# Patient Record
Sex: Female | Born: 1980 | Race: White | Hispanic: No | Smoking: Former smoker
Health system: Southern US, Community
[De-identification: ages and names within clinical notes are randomized; demographics above are authoritative.]

## PROBLEM LIST (undated history)

## (undated) DIAGNOSIS — R112 Nausea with vomiting, unspecified: Secondary | ICD-10-CM

## (undated) DIAGNOSIS — H6990 Unspecified Eustachian tube disorder, unspecified ear: Secondary | ICD-10-CM

## (undated) DIAGNOSIS — K5190 Ulcerative colitis, unspecified, without complications: Secondary | ICD-10-CM

## (undated) DIAGNOSIS — K219 Gastro-esophageal reflux disease without esophagitis: Secondary | ICD-10-CM

## (undated) DIAGNOSIS — Z9889 Other specified postprocedural states: Secondary | ICD-10-CM

## (undated) DIAGNOSIS — G4730 Sleep apnea, unspecified: Secondary | ICD-10-CM

## (undated) DIAGNOSIS — T753XXA Motion sickness, initial encounter: Secondary | ICD-10-CM

## (undated) HISTORY — DX: Sleep apnea, unspecified: G47.30

## (undated) HISTORY — DX: Unspecified eustachian tube disorder, unspecified ear: H69.90

## (undated) HISTORY — PX: ANKLE SURGERY: SHX546

## (undated) HISTORY — DX: Ulcerative colitis, unspecified, without complications: K51.90

---

## 2015-11-08 DIAGNOSIS — K5190 Ulcerative colitis, unspecified, without complications: Secondary | ICD-10-CM | POA: Insufficient documentation

## 2016-12-07 HISTORY — PX: COLONOSCOPY: SHX174

## 2018-09-11 DIAGNOSIS — M79672 Pain in left foot: Secondary | ICD-10-CM | POA: Insufficient documentation

## 2018-09-29 DIAGNOSIS — M792 Neuralgia and neuritis, unspecified: Secondary | ICD-10-CM | POA: Insufficient documentation

## 2018-10-26 DIAGNOSIS — G5702 Lesion of sciatic nerve, left lower limb: Secondary | ICD-10-CM | POA: Insufficient documentation

## 2018-10-26 DIAGNOSIS — M6281 Muscle weakness (generalized): Secondary | ICD-10-CM | POA: Insufficient documentation

## 2019-03-01 DIAGNOSIS — M7541 Impingement syndrome of right shoulder: Secondary | ICD-10-CM | POA: Insufficient documentation

## 2019-03-01 HISTORY — DX: Impingement syndrome of right shoulder: M75.41

## 2020-09-22 ENCOUNTER — Other Ambulatory Visit: Payer: Self-pay

## 2020-09-22 ENCOUNTER — Ambulatory Visit (INDEPENDENT_AMBULATORY_CARE_PROVIDER_SITE_OTHER): Payer: No Typology Code available for payment source

## 2020-09-22 ENCOUNTER — Ambulatory Visit (INDEPENDENT_AMBULATORY_CARE_PROVIDER_SITE_OTHER): Payer: No Typology Code available for payment source | Admitting: Podiatry

## 2020-09-22 DIAGNOSIS — M79672 Pain in left foot: Secondary | ICD-10-CM | POA: Diagnosis not present

## 2020-09-22 DIAGNOSIS — M25572 Pain in left ankle and joints of left foot: Secondary | ICD-10-CM

## 2020-09-22 DIAGNOSIS — G8929 Other chronic pain: Secondary | ICD-10-CM

## 2020-09-22 NOTE — Progress Notes (Signed)
   HPI: 40 y.o. female presenting today as a new patient for evaluation of chronic pain that is been going on for about 5 years.  Patient states that in 2017 she began to experience pain in her left foot and ankle.  Over the past year the pain has exacerbated significantly and it is incredibly painful to walk.  She has been to multiple physicians in the past and tried different custom orthotics as well as immobilization in the cam boot without any improvement.  She continues to have severe pain and she presents for further treatment and evaluation  No past medical history on file.   Physical Exam: General: The patient is alert and oriented x3 in no acute distress.  Dermatology: Skin is warm, dry and supple bilateral lower extremities. Negative for open lesions or macerations.  Vascular: Palpable pedal pulses bilaterally. No edema or erythema noted. Capillary refill within normal limits.  Neurological: Epicritic and protective threshold grossly intact bilaterally.   Musculoskeletal Exam: Range of motion within normal limits to all pedal and ankle joints bilateral. Muscle strength 5/5 in all groups bilateral.  Pain on palpation diffusely throughout the left foot and ankle  Radiographic Exam:  Normal osseous mineralization. Joint spaces preserved. No fracture/dislocation/boney destruction.    Assessment: 1.  Chronic severe pain left foot and ankle   Plan of Care:  1. Patient evaluated. X-Rays reviewed.  2.  The patient has now had this pain ongoing for about 5 years now.  Isolated to the left foot and ankle.  She states that other physicians have tried to attribute the pain to sciatica but she is doubtful. 3.  Today we are going to order MRI left foot and ankle 4.  Return to clinic after MRI results to discuss further treatment options and review the MRI  *Billing/coding for Rhunette Croft, DPM [facility]  Dr. Felecia Shelling, DPM    [address]  [place], [place] [address]                Office [phone]  Fax [phone]

## 2020-10-15 ENCOUNTER — Ambulatory Visit
Admission: RE | Admit: 2020-10-15 | Discharge: 2020-10-15 | Disposition: A | Discharge status: Home / Self Care | Payer: Self-pay | Source: Ambulatory Visit | Attending: Podiatry | Admitting: Podiatry

## 2020-10-15 ENCOUNTER — Ambulatory Visit
Admission: RE | Admit: 2020-10-15 | Discharge: 2020-10-15 | Disposition: A | Discharge status: Home / Self Care | Payer: PRIVATE HEALTH INSURANCE | Source: Ambulatory Visit | Attending: Podiatry | Admitting: Podiatry

## 2020-10-15 ENCOUNTER — Other Ambulatory Visit: Payer: Self-pay

## 2020-10-15 DIAGNOSIS — G8929 Other chronic pain: Secondary | ICD-10-CM

## 2020-10-15 DIAGNOSIS — M25572 Pain in left ankle and joints of left foot: Secondary | ICD-10-CM

## 2020-10-15 IMAGING — MR MR FOOT*L* W/O CM
5 series · 40 of 40 positions shown · non-contrast
Comparison: Right foot radiograph [DATE]

CLINICAL DATA: Foot pain, chronic, stress or occult fracture
suspected, xray done

EXAM:
MRI OF THE LEFT FOOT WITHOUT CONTRAST
TECHNIQUE: Multiplanar, multisequence MR imaging of the left forefoot was
performed. No intravenous contrast was administered.

[Series 4: T1 · coronal · left · 3.0mm · 0.31mm/px · 12 of 44 slices shown (1 of 2)]
[im 1/44]
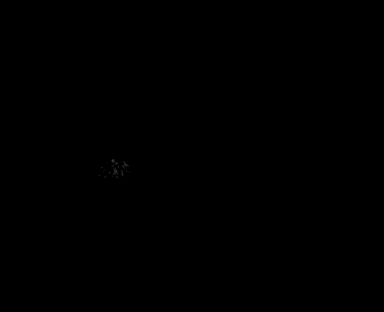
[im 4/44]
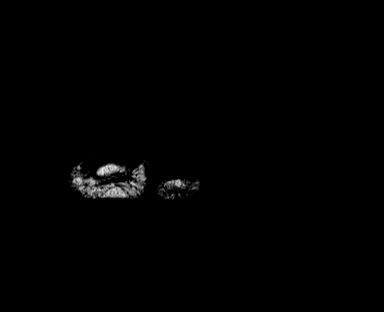
[im 8/44]
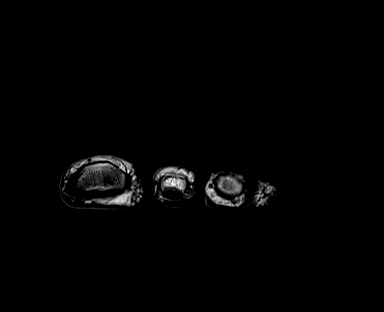
[im 12/44]
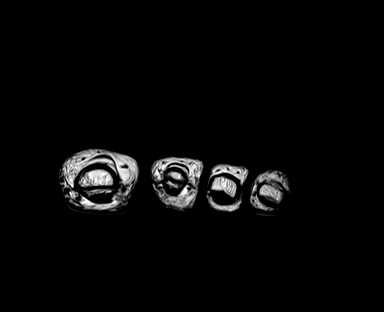
[im 16/44]
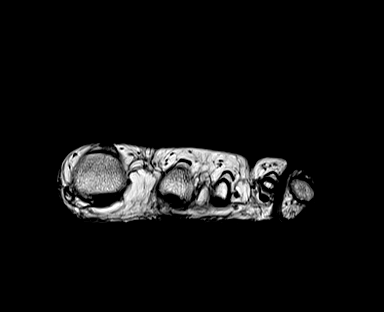
[im 20/44]
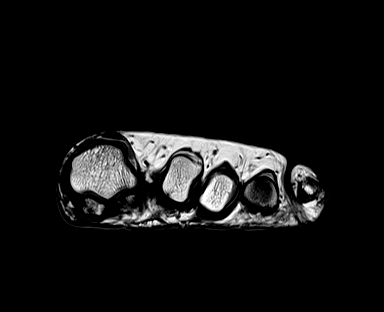
[im 24/44]
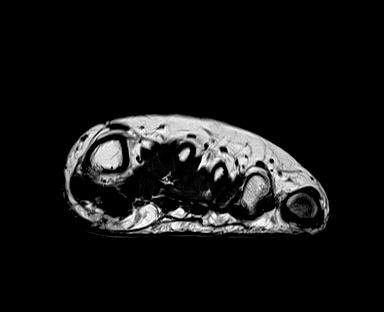
[im 28/44]
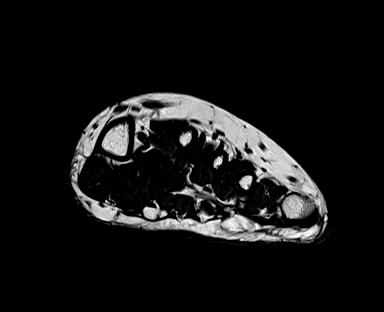
[im 32/44]
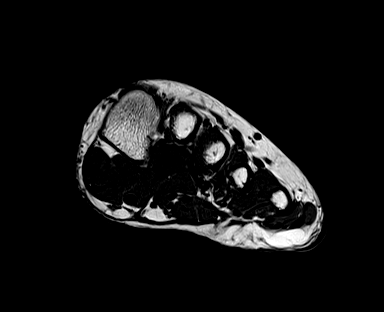
[im 36/44]
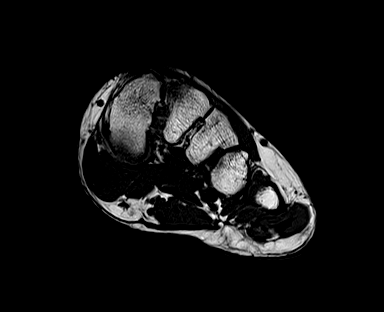
[im 40/44]
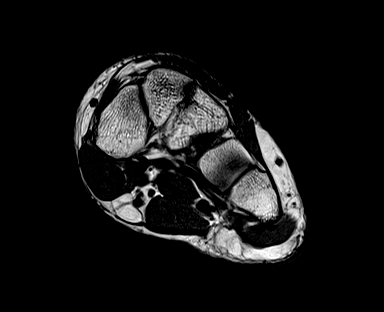
[im 44/44]
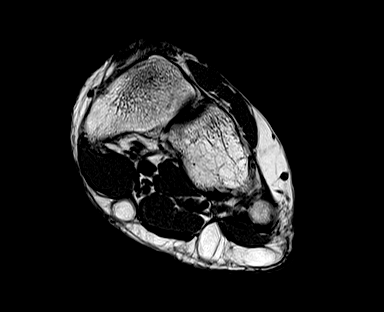

[Series 5: T2 fat-sat · coronal · left · 3.0mm · 0.31mm/px · 11 of 44 slices shown (1 of 2)]
[im 1/44]
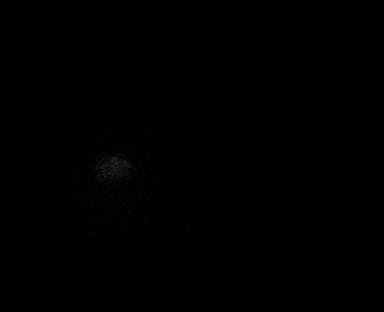
[im 5/44]
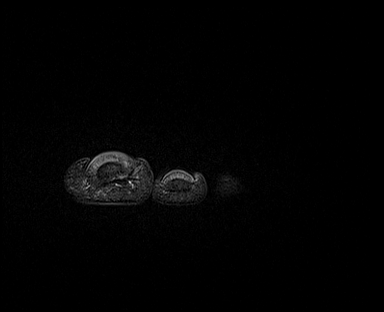
[im 9/44]
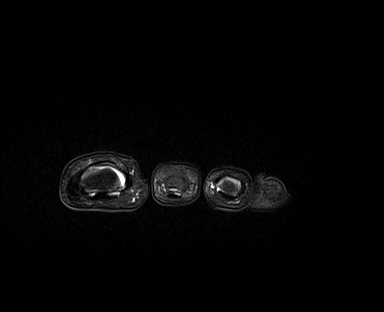
[im 13/44]
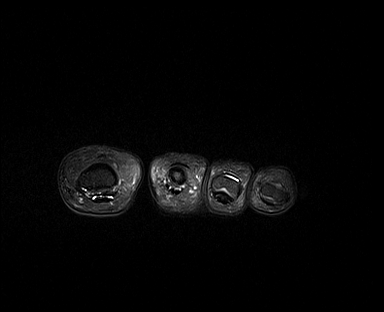
[im 18/44]
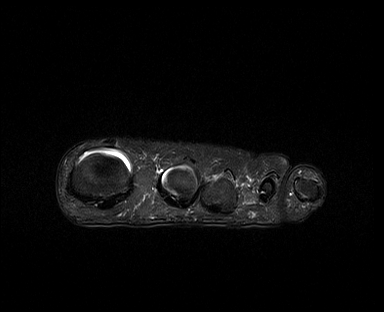
[im 22/44]
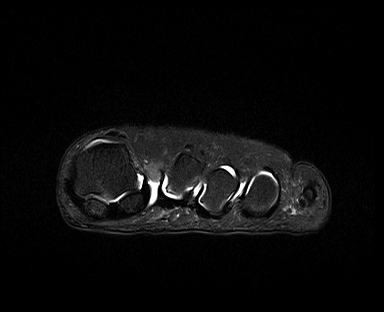
[im 26/44]
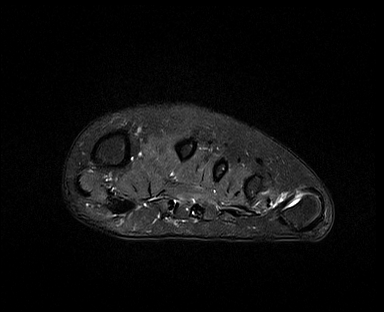
[im 31/44]
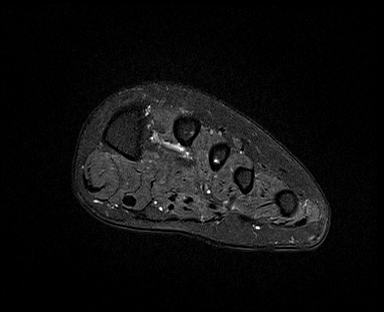
[im 35/44]
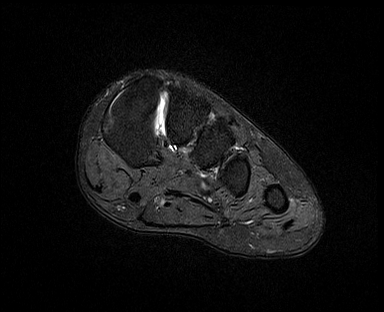
[im 39/44]
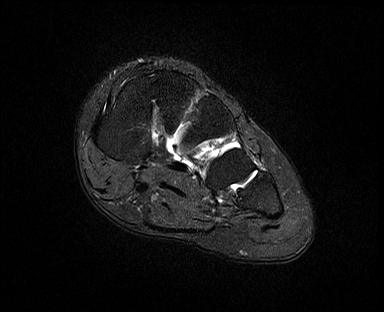
[im 44/44]
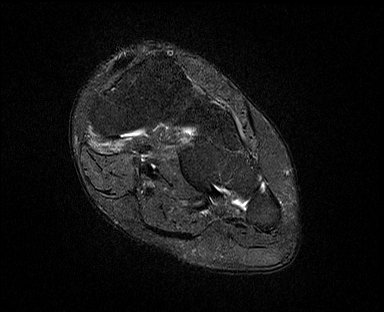

[Series 6: STIR · sagittal · left · 3.0mm · 0.70mm/px · 7 of 27 slices shown]
[im 1/27]
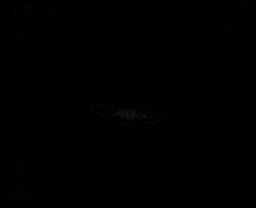
[im 5/27]
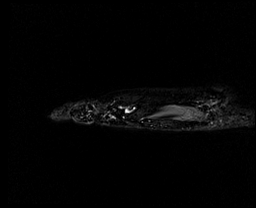
[im 9/27]
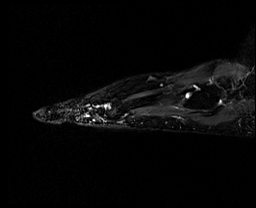
[im 14/27]
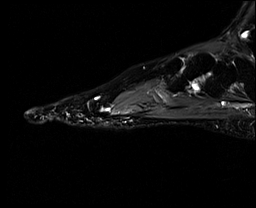
[im 18/27]
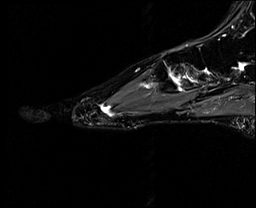
[im 22/27]
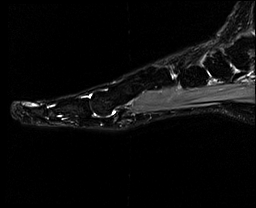
[im 27/27]
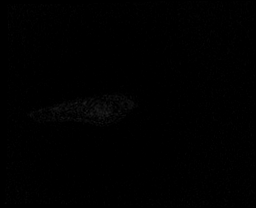

[Series 7: T1 · axial · left · 3.0mm · 0.47mm/px · z∈[-46,+30]mm · 5 of 21 slices shown (2 of 2)]
[im 1/21]
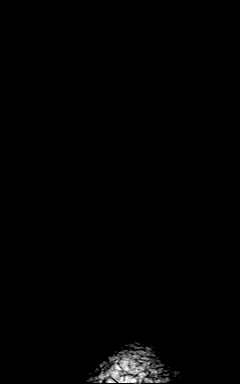
[im 6/21]
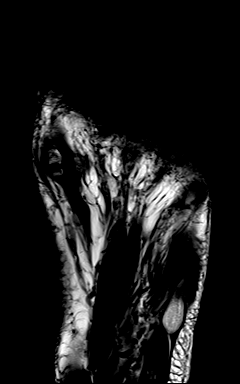
[im 11/21]
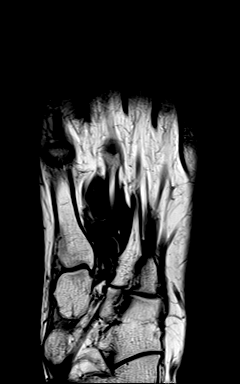
[im 16/21]
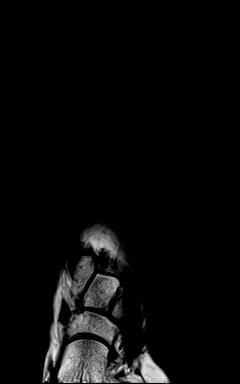
[im 21/21]
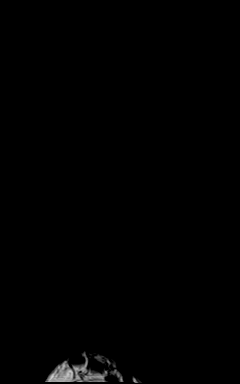

[Series 8: T2 fat-sat · axial · left · 3.0mm · 0.47mm/px · z∈[-46,+30]mm · 5 of 21 slices shown (2 of 2)]
[im 1/21]
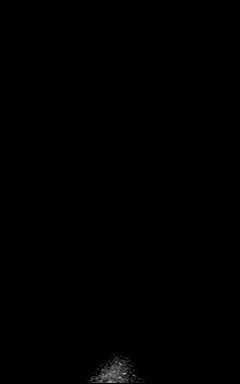
[im 6/21]
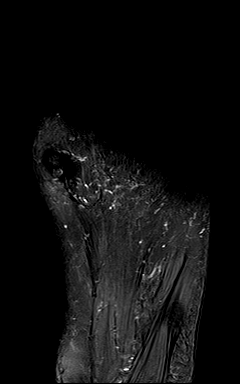
[im 11/21]
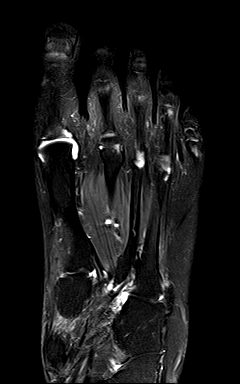
[im 16/21]
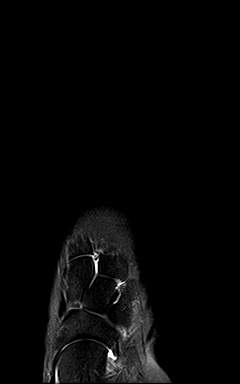
[im 21/21]
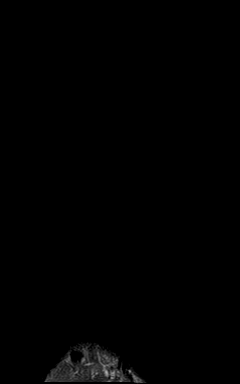

[40 of 40 positions shown; findings below may reference images not displayed]

FINDINGS: Bones/Joint/Cartilage

The cortex is intact. There is no significant marrow signal
alteration.

Ligaments

Intact Lisfranc ligament. Intact lateral ligaments. No evidence of
plantar plate tear.

Muscles and Tendons

No significant muscle atrophy.  No muscle edema.

Soft tissues

No focal fluid collection. No cystic or solid mass. No evidence of
intermetatarsal neuroma or bursitis.
IMPRESSION: Unremarkable left foot MRI.  No acute abnormality.

## 2020-10-15 IMAGING — MR MR ANKLE*L* W/O CM
4 of 5 series · 13 of 40 positions shown · non-contrast
Comparison: Right foot radiograph [DATE].

CLINICAL DATA: Ankle pain, neg xray

EXAM:
MRI OF THE LEFT ANKLE WITHOUT CONTRAST
TECHNIQUE: Multiplanar, multisequence MR imaging of the ankle was performed. No
intravenous contrast was administered.

[Series 4: PD fat-sat · axial · left · 3.0mm · 0.25mm/px · z∈[-12,+84]mm · 4 of 30 slices shown]
[im 1/30]
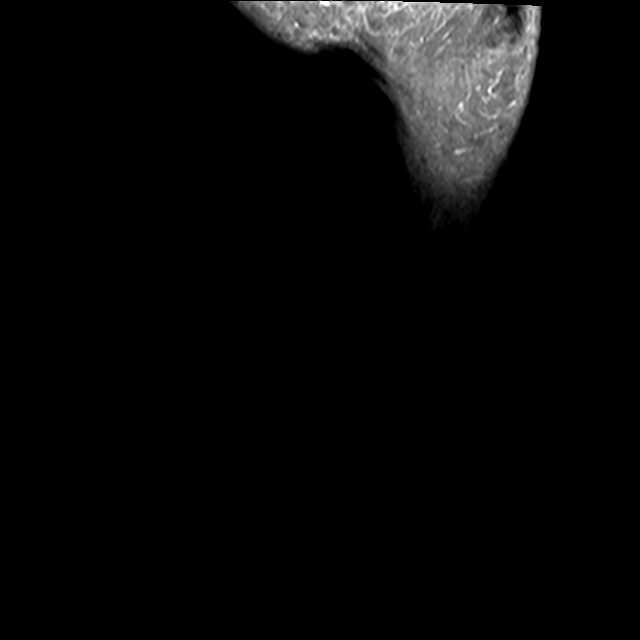
[im 5/30]
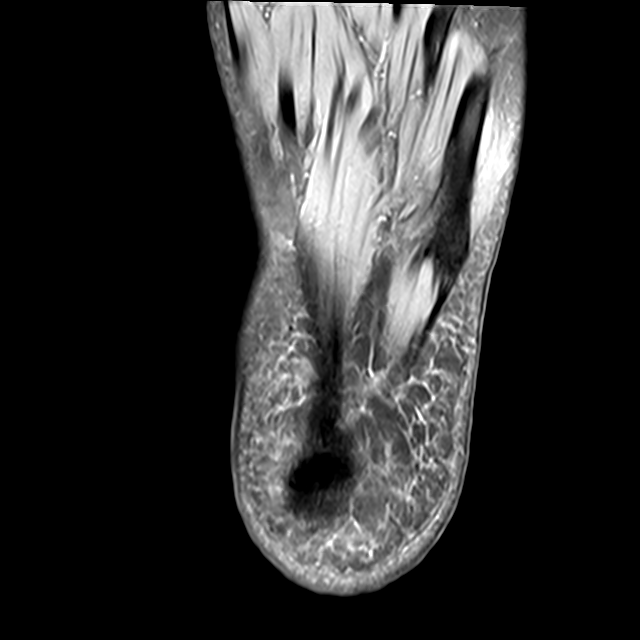
[im 17/30]
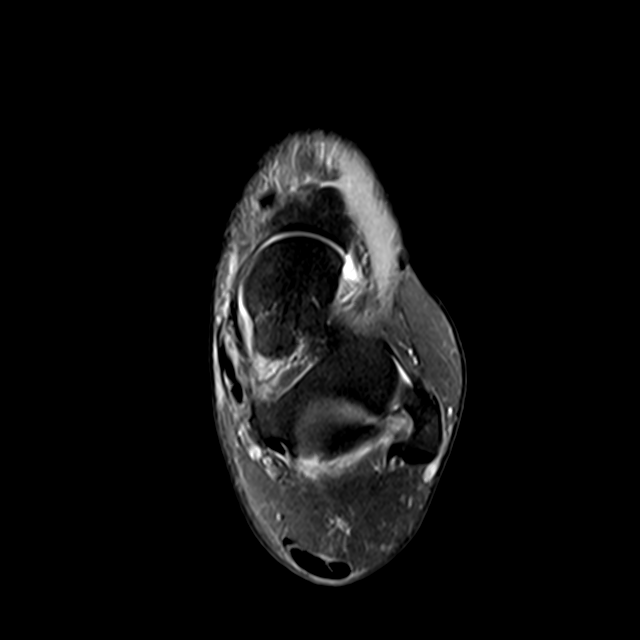
[im 25/30]
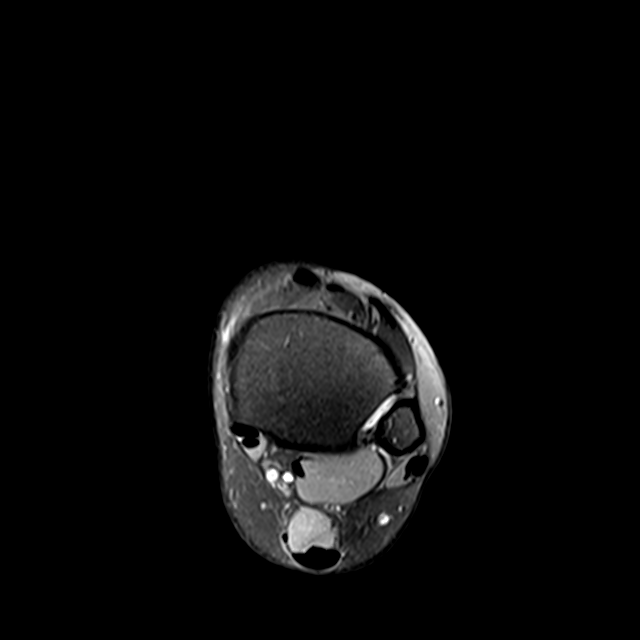

[Series 5: T2 fat-sat · axial · left · 3.0mm · 0.25mm/px · z∈[-0,+88]mm · 3 of 30 slices shown (1 of 2)]
[im 4/30]
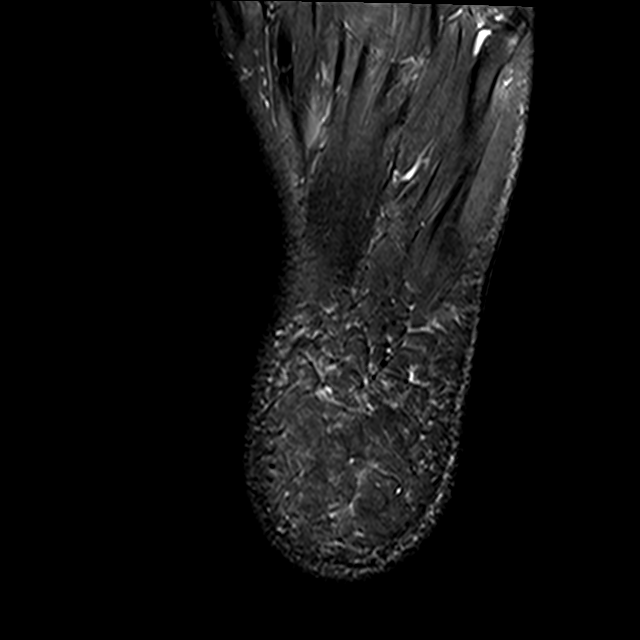
[im 15/30]
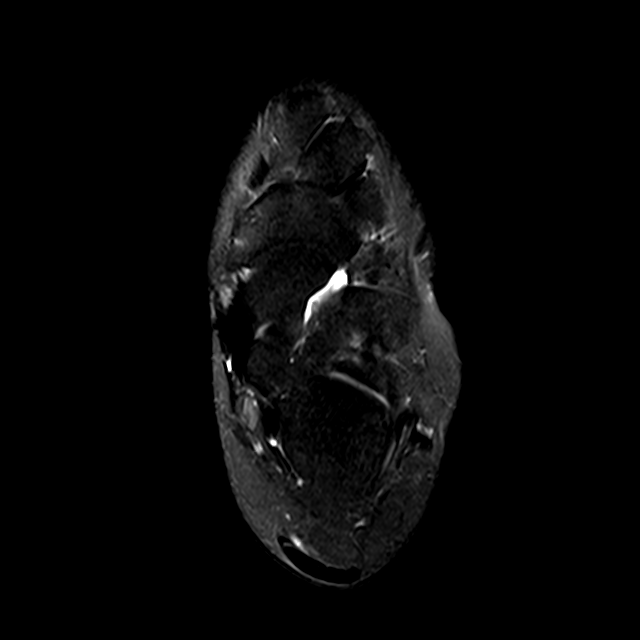
[im 26/30]
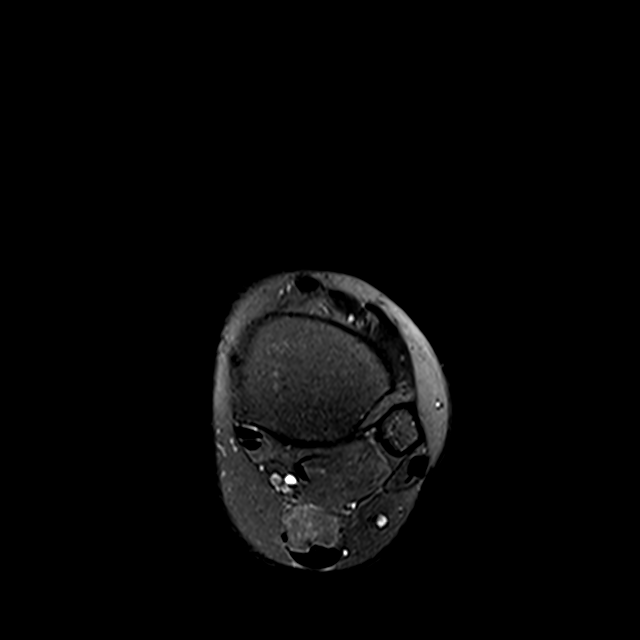

[Series 6: T1 · sagittal · left · 4.0mm · 0.27mm/px · 3 of 24 slices shown]
[im 4/24]
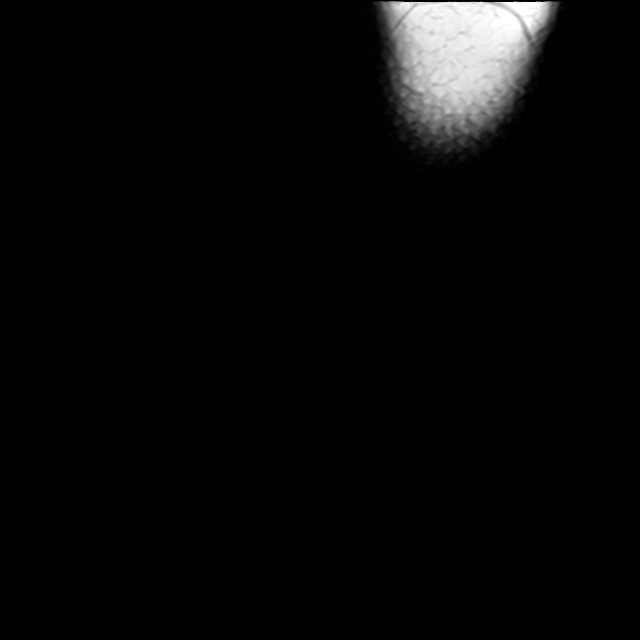
[im 12/24]
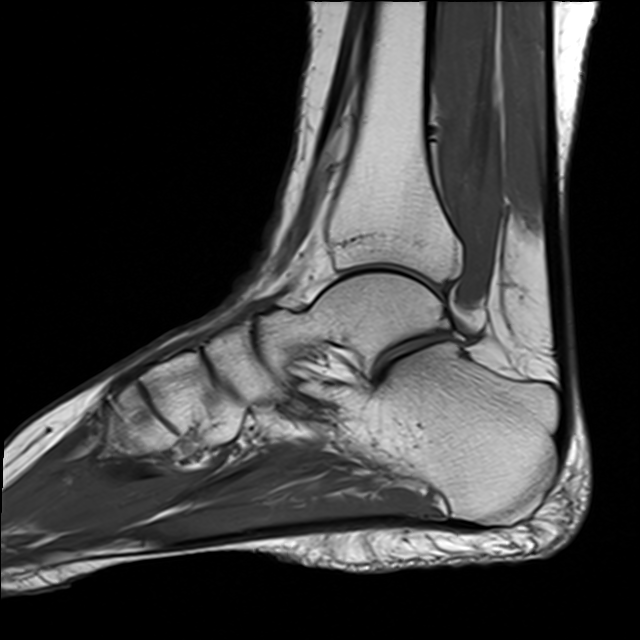
[im 20/24]
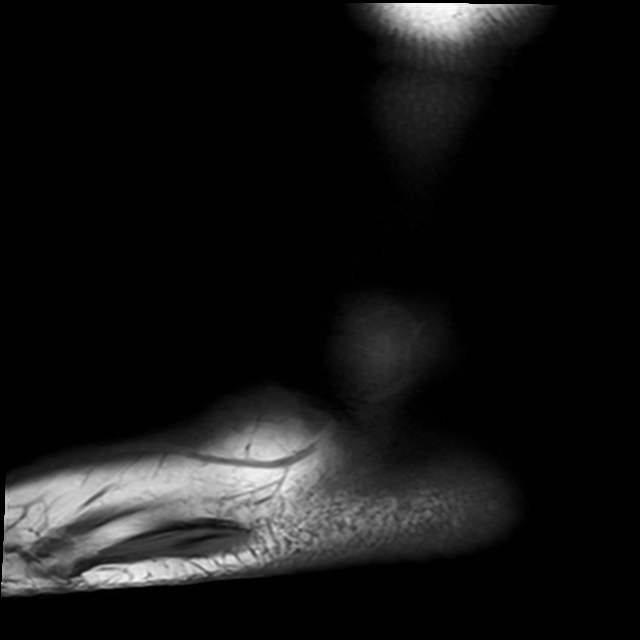

[Series 8: T2 fat-sat · coronal · left · 3.0mm · 0.25mm/px · 3 of 33 slices shown (2 of 2)]
[im 5/33]
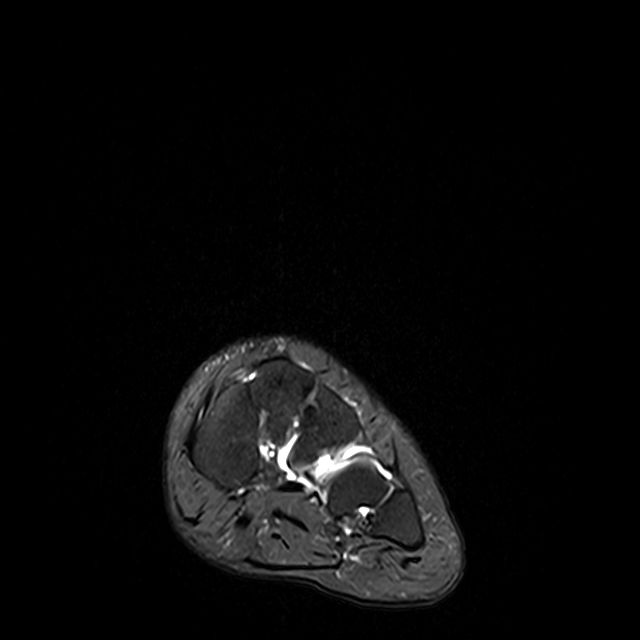
[im 17/33]
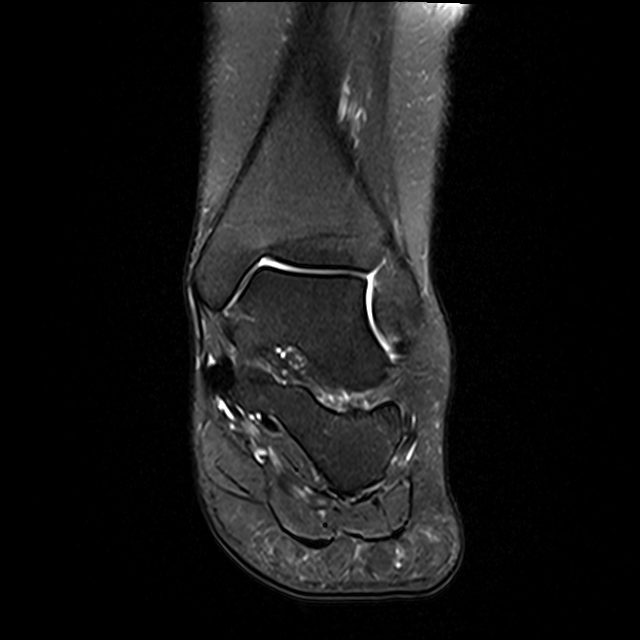
[im 29/33]
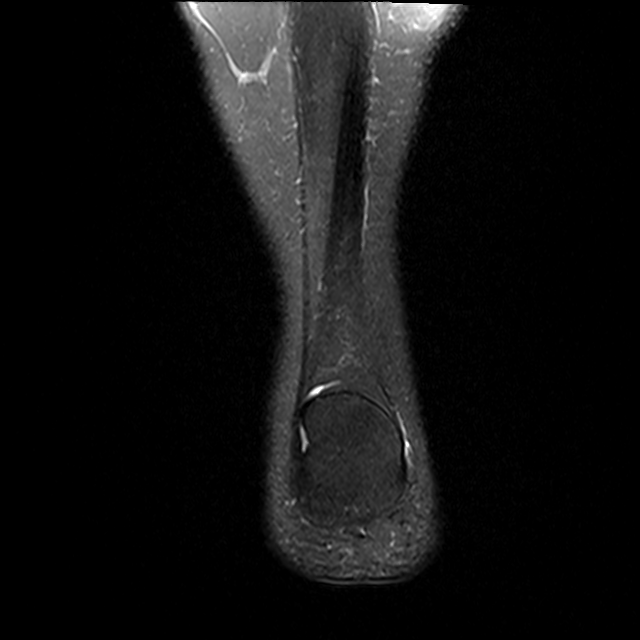

[13 of 40 positions shown; findings below may reference images not displayed]

FINDINGS: TENDONS

Peroneal: Peroneal longus and brevis tendons are intact.

Posteromedial: Posterior tibial tendon intact. Flexor digitorum
longus tendon intact. Flexor hallucis longus tendon intact.

Anterior: Tibialis anterior tendon intact. Extensor hallucis longus
tendon intact Extensor digitorum longus tendon intact.

Achilles:  Intact.

Plantar Fascia: Intact.

LIGAMENTS

Lateral: Anterior talofibular ligament intact. Calcaneofibular
ligament intact. Posterior talofibular ligament intact. Anterior and
posterior tibiofibular ligaments intact.

Medial: Deltoid ligament intact. Spring ligament intact.

CARTILAGE

Ankle Joint: No joint effusion. Normal ankle mortise. No chondral
defect.

Subtalar Joints/Sinus Tarsi: Normal subtalar joints. No subtalar
joint effusion. Normal sinus tarsi.

Bones: No marrow signal abnormality.  No fracture or dislocation.

Soft Tissue: No fluid collection or hematoma. Muscles are normal
without edema or atrophy. Tarsal tunnel is normal.
IMPRESSION: Unremarkable left ankle MRI. No evidence of tendon or ligamentous
injury. No acute osseous abnormality.

## 2020-11-06 ENCOUNTER — Ambulatory Visit: Payer: No Typology Code available for payment source | Admitting: Podiatry

## 2020-11-17 ENCOUNTER — Other Ambulatory Visit: Payer: Self-pay

## 2020-11-17 ENCOUNTER — Ambulatory Visit (INDEPENDENT_AMBULATORY_CARE_PROVIDER_SITE_OTHER): Payer: No Typology Code available for payment source | Admitting: Podiatry

## 2020-11-17 DIAGNOSIS — M79672 Pain in left foot: Secondary | ICD-10-CM

## 2020-11-17 DIAGNOSIS — G8929 Other chronic pain: Secondary | ICD-10-CM | POA: Diagnosis not present

## 2020-11-17 DIAGNOSIS — M25572 Pain in left ankle and joints of left foot: Secondary | ICD-10-CM | POA: Diagnosis not present

## 2020-11-17 NOTE — Progress Notes (Signed)
   HPI: 40 y.o. female presenting today for follow-up evaluation of chronic right foot and ankle pain.  Patient had MRIs performed since last visit and she presents today to review the results and discuss different treatment options  No past medical history on file.   Physical Exam: General: The patient is alert and oriented x3 in no acute distress.  Dermatology: Skin is warm, dry and supple bilateral lower extremities. Negative for open lesions or macerations.  Vascular: Palpable pedal pulses bilaterally. No edema or erythema noted. Capillary refill within normal limits.  Neurological: Epicritic and protective threshold grossly intact bilaterally.   Musculoskeletal Exam: Range of motion within normal limits to all pedal and ankle joints bilateral. Muscle strength 5/5 in all groups bilateral.   MR foot LT wo contrast 10/15/2020: IMPRESSION: Unremarkable left foot MRI.  No acute abnormality.  MR ankle LT wo contrast 10/15/2020: IMPRESSION: Unremarkable left ankle MRI. No evidence of tendon or ligamentous injury. No acute osseous abnormality.   Assessment: 1.  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 Patient evaluated.  MRIs reviewed.  2.  Unfortunately the patient has had pain and tenderness associated to the right foot and ankle for the past 5 years which is slowly increased over the past several months.  Unfortunately she has tried multiple conservative modalities including physical therapy, steroid injections, nerve blocks, custom orthotics.  She is unable to take NSAIDs due to ulcerative colitis.  Unfortunately I am not able to offer any additional modalities the patient has not already pursued 3.  The patient would like to pursue chiropractic care.  I do not believe this would help significantly but I am not opposed to pursuing this modality since other modalities have failed 4.  Return to clinic as needed      Felecia Shelling, DPM [facility]  Dr. Felecia Shelling, DPM    [address] [place], [place] [address]                Office [phone]  Fax [phone]

## 2020-12-13 ENCOUNTER — Ambulatory Visit: Payer: Self-pay | Admitting: Family Medicine

## 2020-12-26 ENCOUNTER — Ambulatory Visit (INDEPENDENT_AMBULATORY_CARE_PROVIDER_SITE_OTHER): Payer: No Typology Code available for payment source | Admitting: Family Medicine

## 2020-12-26 ENCOUNTER — Encounter: Payer: Self-pay | Admitting: Family Medicine

## 2020-12-26 ENCOUNTER — Other Ambulatory Visit: Payer: Self-pay

## 2020-12-26 VITALS — BP 124/84 | HR 87 | Ht 63.0 in | Wt 151.0 lb

## 2020-12-26 DIAGNOSIS — G5702 Lesion of sciatic nerve, left lower limb: Secondary | ICD-10-CM | POA: Diagnosis not present

## 2020-12-26 DIAGNOSIS — M5410 Radiculopathy, site unspecified: Secondary | ICD-10-CM | POA: Insufficient documentation

## 2020-12-26 DIAGNOSIS — M4727 Other spondylosis with radiculopathy, lumbosacral region: Secondary | ICD-10-CM

## 2020-12-26 DIAGNOSIS — Z23 Encounter for immunization: Secondary | ICD-10-CM

## 2020-12-26 HISTORY — DX: Other spondylosis with radiculopathy, lumbosacral region: M47.27

## 2020-12-26 MED ORDER — DULOXETINE HCL 30 MG PO CPEP: ORAL_CAPSULE | ORAL | 0 refills | Status: DC

## 2020-12-26 NOTE — Assessment & Plan Note (Signed)
See additional assessment(s) for plan details. 

## 2020-12-26 NOTE — Assessment & Plan Note (Signed)
Patient presenting with roughly 6 years of primarily left lateral foot pain, describes radiation proximally to the deep left buttock region, the symptoms were atraumatic in onset, noted after carrying/pregnancy with her child in 2015.  At that time she had a relatively brief period of sharper symptoms from the low back to her left leg to about her foot since that time symptoms outside of her foot had been negligible and near absent.  Her left lateral foot symptoms have persisted, was diagnosed with ankle instability.  Over the past 2 years she has noted recurrence of low back and primarily deep gluteal pain associated with prolonged sitting or standing, during these episodes her foot symptoms are progressively worse.  Over the course of treatment by outside providers she has had EMG studies, imaging of her low back, advanced imaging of her left foot and ankle- all of which, per patient, were relatively benign.  From a treatment standpoint she has had what she describes as epidural injections, nerve blocks, varying oral medications, and physical therapy.  She did note improvement in her pain with Lyrica but could not tolerate due to reported hair loss.  Physical examination today reveals maintained sensation in bilateral lower extremities that is symmetric, she has positive straight leg raise on the left which recreates her symptoms, positive piriformis tenderness and pain during FABER, symmetric tenderness at bilateral SI joints, left greater trochanter, and tracking along the gluteus medius and piriformis.  Foot/ankle findings benign.  Given her constellation of symptoms, evaluation and management thus far, primary concern is for lumbosacral radiculopathy with comorbid piriformis syndrome.  I reviewed the nature of this condition as well as treatment strategy given her comorbid ulcerative colitis/relative medical contraindications.  We will initiate duloxetine at 30 mg with plan for self-titration in 2 weeks,  follow-up in 6 weeks for reevaluation.  If tolerating medication well with improvement, plan for home-based versus formal physical therapy.  If suboptimal response, adjunct ultrasound-guided piriformis injection to be considered.

## 2020-12-26 NOTE — Patient Instructions (Signed)
-   Start duloxetine 1 capsule (30 mg) daily x2 weeks - Start duloxetine 2 capsules (60 mg) daily after 2 weeks until follow-up - Return for follow-up in 6 weeks, contact us for any questions between now and then

## 2020-12-26 NOTE — Progress Notes (Signed)
Primary Care / Sports Medicine Office Visit  Patient Information:  Patient ID: Michaela Cox, female DOB: [birth year] Age: 40 y.o. MRN: [id number]   Michaela Cox is a pleasant 40 y.o. female presenting with the following:  Chief Complaint  Patient presents with   New Patient (Initial Visit)   Establish Care    Patient Active Problem List   Diagnosis Date Noted   Radicular pain of left lower extremity 12/26/2020   Impingement syndrome of right shoulder region 03/01/2019   Piriformis syndrome, left 10/26/2018   Muscle weakness 10/26/2018   Neuropathic pain 09/29/2018   Pain in left foot 09/11/2018   Ulcerative colitis ([place]) 11/08/2015    Vitals:   12/26/20 0922  BP: 124/84  Pulse: 87  SpO2: 99%   Vitals:   12/26/20 0922  Weight: 151 lb (68.5 kg)  Height: 5\' 3"  (1.6 m)   Body mass index is 26.75 kg/m.  No results found.   Independent interpretation of notes and tests performed by another provider:   None  Procedures performed:   None  Pertinent History, Exam, Impression, and Recommendations:   Piriformis syndrome, left Patient presenting with roughly 6 years of primarily left lateral foot pain, describes radiation proximally to the deep left buttock region, the symptoms were atraumatic in onset, noted after carrying/pregnancy with her child in 2015.  At that time she had a relatively brief period of sharper symptoms from the low back to her left leg to about her foot since that time symptoms outside of her foot had been negligible and near absent.  Her left lateral foot symptoms have persisted, was diagnosed with ankle instability.  Over the past 2 years she has noted recurrence of low back and primarily deep gluteal pain associated with prolonged sitting or standing, during these episodes her foot symptoms are progressively worse.  Over the course of treatment by outside providers she has had EMG studies, imaging of her low back, advanced imaging of her left  foot and ankle- all of which, per patient, were relatively benign.  From a treatment standpoint she has had what she describes as epidural injections, nerve blocks, varying oral medications, and physical therapy.  She did note improvement in her pain with Lyrica but could not tolerate due to reported hair loss.  Physical examination today reveals maintained sensation in bilateral lower extremities that is symmetric, she has positive straight leg raise on the left which recreates her symptoms, positive piriformis tenderness and pain during FABER, symmetric tenderness at bilateral SI joints, left greater trochanter, and tracking along the gluteus medius and piriformis.  Foot/ankle findings benign.  Given her constellation of symptoms, evaluation and management thus far, primary concern is for lumbosacral radiculopathy with comorbid piriformis syndrome.  I reviewed the nature of this condition as well as treatment strategy given her comorbid ulcerative colitis/relative medical contraindications.  We will initiate duloxetine at 30 mg with plan for self-titration in 2 weeks, follow-up in 6 weeks for reevaluation.  If tolerating medication well with improvement, plan for home-based versus formal physical therapy.  If suboptimal response, adjunct ultrasound-guided piriformis injection to be considered.  Radicular pain of left lower extremity See additional assessment(s) for plan details.   Orders & Medications Meds ordered this encounter  Medications   DULoxetine (CYMBALTA) 30 MG capsule    Sig: Take 1 capsule (30 mg total) by mouth every evening for 14 days, THEN 2 capsules (60 mg total) every evening.    Dispense:  74 capsule  Refill:  0   Orders Placed This Encounter  Procedures   Flu Vaccine QUAD 92mo+IM (Fluarix, Fluzone & Alfiuria Quad PF)     Return in about 6 weeks (around 02/06/2021).     Jerrol Banana, MD   Primary Care Sports Medicine [facility]

## 2021-02-06 ENCOUNTER — Other Ambulatory Visit: Payer: Self-pay

## 2021-02-06 ENCOUNTER — Ambulatory Visit (INDEPENDENT_AMBULATORY_CARE_PROVIDER_SITE_OTHER): Payer: No Typology Code available for payment source | Admitting: Family Medicine

## 2021-02-06 ENCOUNTER — Encounter: Payer: Self-pay | Admitting: Family Medicine

## 2021-02-06 VITALS — BP 118/78 | HR 82 | Ht 63.0 in | Wt 152.0 lb

## 2021-02-06 DIAGNOSIS — M5410 Radiculopathy, site unspecified: Secondary | ICD-10-CM | POA: Diagnosis not present

## 2021-02-06 DIAGNOSIS — G5702 Lesion of sciatic nerve, left lower limb: Secondary | ICD-10-CM | POA: Diagnosis not present

## 2021-02-06 MED ORDER — GABAPENTIN 100 MG PO CAPS: 100.0000 mg | ORAL_CAPSULE | Freq: Every day | ORAL | 1 refills | Status: DC

## 2021-02-06 MED ORDER — CYCLOBENZAPRINE HCL 5 MG PO TABS: 5.0000 mg | ORAL_TABLET | Freq: Every evening | ORAL | 0 refills | Status: DC | PRN

## 2021-02-06 MED ORDER — DULOXETINE HCL 30 MG PO CPEP: 30.0000 mg | ORAL_CAPSULE | Freq: Every day | ORAL | 0 refills | Status: DC

## 2021-02-06 NOTE — Progress Notes (Signed)
°  ° °  Primary Care / Sports Medicine Office Visit  Patient Information:  Patient ID: Michaela Cox, female DOB: 1980-01-31 Age: 41 y.o. MRN: [id number]   Michaela Cox is a pleasant 41 y.o. female presenting with the following:  Chief Complaint  Patient presents with   Piriformis syndrome, left    Better; 1/10 pain    Vitals:   02/06/21 0911  BP: 118/78  Pulse: 82  SpO2: 99%   Vitals:   02/06/21 0911  Weight: 152 lb (68.9 kg)  Height: 5\' 3"  (1.6 m)   Body mass index is 26.93 kg/m.  No results found.   Independent interpretation of notes and tests performed by another provider:   None  Procedures performed:   None  Pertinent History, Exam, Impression, and Recommendations:   Piriformis syndrome, left Patient presents for follow-up to chronic left deep hip pain with paresthesias down the left leg, ongoing for roughly 6 years.  At the last visit on 12/26/2020 concern was for lumbosacral radiculopathy with comorbid piriformis syndrome, held from NSAIDs due to GI concern, initiated duloxetine at 30 mg with titration to 60 mg.  She states that she was able to titrate duloxetine accordingly, has not noted any benefit from this course, continues to note symptoms with day-to-day activity, no significant low back pain noted.  From examination standpoint, she now has a negative straight leg raise.  She does continue to demonstrate positive Corky Sox and positive piriformis testing with now more focal pain tract along the piriformis musculature.  During piriformis manipulation she does endorse left lower extremity onset of paresthesias.  Her findings are again consistent with piriformis syndrome of the left involving the sciatic nerve, uncontrolled.  I reviewed next steps from a treatment standpoint and, from medication management standpoint, we will wean patient off of duloxetine by transition to 30 mg a dose x1 week then discontinue, additionally initiate as needed cyclobenzaprine 5  mg, and nightly gabapentin 100 mg.  She will return for close follow-up to assess response to these interventions.  If suboptimal progress noted, can further titrate medications and proceed with ultrasound-guided corticosteroid injection of the piriformis/sciatic nerve junction.  Chronic condition, uncontrolled, Rx management  Radicular pain of left lower extremity See additional assessment(s) for plan details.   Orders & Medications Meds ordered this encounter  Medications   DULoxetine (CYMBALTA) 30 MG capsule    Sig: Take 1 capsule (30 mg total) by mouth daily.    Dispense:  7 capsule    Refill:  0   gabapentin (NEURONTIN) 100 MG capsule    Sig: Take 1 capsule (100 mg total) by mouth at bedtime.    Dispense:  30 capsule    Refill:  1   cyclobenzaprine (FLEXERIL) 5 MG tablet    Sig: Take 1 tablet (5 mg total) by mouth at bedtime as needed for muscle spasms.    Dispense:  30 tablet    Refill:  0   Orders Placed This Encounter  Procedures   Ambulatory referral to Physical Therapy     Return in about 1 day (around 02/07/2021).     Montel Culver, MD   Primary Care Sports Medicine [place]

## 2021-02-06 NOTE — Patient Instructions (Addendum)
-   Transition dose of duloxetine to 30 mg, take for 1 week then discontinue - Start gabapentin nightly - Can dose cyclobenzaprine on an as-needed basis for muscle tightness pain - Return once scheduled for follow-up - Referral coordinator will contact you for physical therapy scheduling - Contact us for any questions between now and then

## 2021-02-06 NOTE — Assessment & Plan Note (Addendum)
Patient presents for follow-up to chronic left deep hip pain with paresthesias down the left leg, ongoing for roughly 6 years.  At the last visit on 12/26/2020 concern was for lumbosacral radiculopathy with comorbid piriformis syndrome, held from NSAIDs due to GI concern, initiated duloxetine at 30 mg with titration to 60 mg.  She states that she was able to titrate duloxetine accordingly, has not noted any benefit from this course, continues to note symptoms with day-to-day activity, no significant low back pain noted.  From examination standpoint, she now has a negative straight leg raise.  She does continue to demonstrate positive Corky Sox and positive piriformis testing with now more focal pain tract along the piriformis musculature.  During piriformis manipulation she does endorse left lower extremity onset of paresthesias.  Her findings are again consistent with piriformis syndrome of the left involving the sciatic nerve, uncontrolled.  I reviewed next steps from a treatment standpoint and, from medication management standpoint, we will wean patient off of duloxetine by transition to 30 mg a dose x1 week then discontinue, additionally initiate as needed cyclobenzaprine 5 mg, and nightly gabapentin 100 mg.  She will return for close follow-up to assess response to these interventions.  If suboptimal progress noted, can further titrate medications and proceed with ultrasound-guided corticosteroid injection of the piriformis/sciatic nerve junction.  Chronic condition, uncontrolled, Rx management

## 2021-02-06 NOTE — Assessment & Plan Note (Signed)
See additional assessment(s) for plan details. 

## 2021-02-08 ENCOUNTER — Encounter: Payer: Self-pay | Admitting: Family Medicine

## 2021-02-08 ENCOUNTER — Inpatient Hospital Stay: Payer: Self-pay | Admitting: Radiology

## 2021-02-08 ENCOUNTER — Other Ambulatory Visit: Payer: Self-pay

## 2021-02-08 ENCOUNTER — Ambulatory Visit (INDEPENDENT_AMBULATORY_CARE_PROVIDER_SITE_OTHER): Payer: No Typology Code available for payment source | Admitting: Family Medicine

## 2021-02-08 VITALS — BP 132/90 | HR 83 | Ht 63.0 in | Wt 152.0 lb

## 2021-02-08 DIAGNOSIS — M5410 Radiculopathy, site unspecified: Secondary | ICD-10-CM

## 2021-02-08 DIAGNOSIS — G5702 Lesion of sciatic nerve, left lower limb: Secondary | ICD-10-CM

## 2021-02-08 MED ORDER — TRIAMCINOLONE ACETONIDE 40 MG/ML IJ SUSP
80.0000 mg | Freq: Once | INTRAMUSCULAR | Status: AC
  Administered 2021-02-08: 80 mg via INTRAMUSCULAR

## 2021-02-08 NOTE — Assessment & Plan Note (Signed)
Patient presents for scheduled left piriformis trigger point injection and sciatic nerve injection with cortisone in the setting of piriformis syndrome.  Patient tolerated the procedure well, post care reviewed, I have advised her to contact physical therapy for expedited scheduling, perform this for 6 weeks and return thereafter.  Over the interim she can dose cyclobenzaprine on as-needed basis and we have discussed titration of gabapentin by 100 mg every 3 nights to find lowest dose that adequately controls neuropathic pain symptoms in the left lower extremity.

## 2021-02-08 NOTE — Patient Instructions (Addendum)
You have just been given a cortisone injection to reduce pain and inflammation. After the injection you may notice immediate relief of pain as a result of the Lidocaine. It is important to rest the area of the injection for 24 to 48 hours after the injection. There is a possibility of some temporary increased discomfort and swelling for up to 72 hours until the cortisone begins to work. If you do have pain, simply rest the joint and use ice. If you can tolerate over the counter medications, you can try Tylenol for added relief per package instructions. - Start physical therapy once scheduled, contact number for low for expedited scheduling - Continue gabapentin nightly, can increase by 1 capsule every 3 nights until he reaches lowest dose that adequately controls shooting pain down the leg - Continue nightly cyclobenzaprine on an as-needed basis - Return for follow-up after physical therapy in 6 weeks - Contact us for any questions between now and then [facility]:  [place]:  [phone]  [place]: [phone]

## 2021-02-08 NOTE — Assessment & Plan Note (Signed)
See additional assessment(s) for plan details. 

## 2021-02-08 NOTE — Progress Notes (Signed)
°  ° °  Primary Care / Sports Medicine Office Visit  Patient Information:  Patient ID: Savayah Schachner, female DOB: 1980/12/03 Age: 41 y.o. MRN: [id number]   Brodie Voisine is a pleasant 41 y.o. female presenting with the following:  Chief Complaint  Patient presents with   Hip Pain    Injection    Ankle Pain    Vitals:   02/08/21 1512  BP: 132/90  Pulse: 83  SpO2: 99%   Vitals:   02/08/21 1512  Weight: 152 lb (68.9 kg)  Height: 5\' 3"  (1.6 m)   Body mass index is 26.93 kg/m.  No results found.   Independent interpretation of notes and tests performed by another provider:   None  Procedures performed:   Procedure:  Injection of sciatic nerve at the junction of the piriformis under ultrasound guidance. Ultrasound guidance utilized for visualization of the piriformis and sciatic nerve junction, hypoechoic piriformis can be consistent with interstitial atrophy, injectate visualized Samsung HS60 device utilized with permanent recording / reporting. Verbal informed consent obtained and verified. Skin prepped in a sterile fashion. Ethyl chloride for topical local analgesia.  Completed without difficulty and tolerated well. Medication: triamcinolone acetonide 40 mg/mL suspension for injection 1 mL total and 2 mL lidocaine 1% without epinephrine utilized for needle placement anesthetic Advised to contact for fevers/chills, erythema, induration, drainage, or persistent bleeding.   Pertinent History, Exam, Impression, and Recommendations:   Piriformis syndrome, left Patient presents for scheduled left piriformis trigger point injection and sciatic nerve injection with cortisone in the setting of piriformis syndrome.  Patient tolerated the procedure well, post care reviewed, I have advised her to contact physical therapy for expedited scheduling, perform this for 6 weeks and return thereafter.  Over the interim she can dose cyclobenzaprine on as-needed basis and we have discussed  titration of gabapentin by 100 mg every 3 nights to find lowest dose that adequately controls neuropathic pain symptoms in the left lower extremity.  Radicular pain of left lower extremity See additional assessment(s) for plan details.   Orders & Medications Meds ordered this encounter  Medications   triamcinolone acetonide (KENALOG-40) injection 80 mg   Orders Placed This Encounter  Procedures   [place] LIMITED JOINT SPACE STRUCTURES LOW LEFT     No follow-ups on file.     Montel Culver, MD   Primary Care Sports Medicine [place]

## 2021-02-13 ENCOUNTER — Other Ambulatory Visit: Payer: Self-pay

## 2021-02-13 ENCOUNTER — Ambulatory Visit: Payer: No Typology Code available for payment source | Attending: Family Medicine | Admitting: Physical Therapy

## 2021-02-13 ENCOUNTER — Encounter: Payer: Self-pay | Admitting: Physical Therapy

## 2021-02-13 DIAGNOSIS — R293 Abnormal posture: Secondary | ICD-10-CM | POA: Insufficient documentation

## 2021-02-13 DIAGNOSIS — G5702 Lesion of sciatic nerve, left lower limb: Secondary | ICD-10-CM | POA: Diagnosis not present

## 2021-02-13 DIAGNOSIS — M25552 Pain in left hip: Secondary | ICD-10-CM | POA: Insufficient documentation

## 2021-02-13 DIAGNOSIS — M6281 Muscle weakness (generalized): Secondary | ICD-10-CM | POA: Diagnosis present

## 2021-02-13 DIAGNOSIS — M5410 Radiculopathy, site unspecified: Secondary | ICD-10-CM | POA: Insufficient documentation

## 2021-02-13 NOTE — Therapy (Signed)
[facility] [address] [place], [place], [address] Phone: [phone]   Fax:  [phone]  Physical Therapy Evaluation  Patient Details  Name: Michaela Cox MRN: [id number] Date of Birth: 1980/11/03 Referring Provider (PT): Montel Culver   Encounter Date: 02/13/2021   PT End of Session - 02/13/21 1156     Visit Number 1    Number of Visits 9    Date for PT Re-Evaluation 03/13/21    Authorization Type IE 02/13/2021    PT Start Time 1200    PT Stop Time 1240    PT Time Calculation (min) 40 min    Activity Tolerance Patient tolerated treatment well    Behavior During Therapy [facility] for tasks assessed/performed             Past Medical History:  Diagnosis Date   Eustachian tube disorder    Ulcerative colitis ([place])     Past Surgical History:  Procedure Laterality Date   COLONOSCOPY      There were no vitals filed for this visit.        [facility] PT Assessment - 02/13/21 0001       Assessment   Medical Diagnosis piriformis syndrome L    Referring Provider (PT) Montel Culver    Hand Dominance Right    Prior Therapy None      Balance Screen   Has the patient fallen in the past 6 months No             SUBJECTIVE Chief complaint:  Patient reports that for 2 years she has been dealing with LLE pain. It originated in the foot/ankle and then radiates to the L SIJ. Patient notes pain is always present, but varying intensity. Patient notes that the sensation just feels weak like it is going to give out. Patient endorses associated heaviness with the affected limb. Patient has previously had experiences with the LLE giving way one of which resulted in a fall (sometime last year). Patient adds that sitting and walking are provocative. Patient has had to adjust wellness activities to rowing and cycling to avoid pain. Patient denies radiating symptoms, but notes the pain rather provokes another pain site. Patient states  that the only symptom that seems to encompass the whole leg is swelling.   " Over the course of treatment by outside providers she has had EMG studies, imaging of her low back, advanced imaging of her left foot and ankle- all of which, per patient, were relatively benign." Per MD.   Pain location: L ankle, L gluteals Pain: Present 4-5/10, Best 1-2/10, Worst 6/10: Pain quality: pain quality: dull and weakness Radiating pain: No  Numbness/Tingling: No  24 hour pain behavior:  Aggravating factors: sitting, walking/hiking Easing factors: position changes How long can you sit: < 5 min How long can you stand: 30 min How long can you walk: 30 min  Occupational demands: sedentary desk job  Current activities: caregiving, cleaning; walking/hiking (unable because of leg)  Patient stated goals: Return to hiking, manage pain  SCREENING Red Flags: None Have you had any night sweats? Unexplained weight loss? Saddle anesthesia? Unexplained changes in bowel or bladder habits?  Mental Status Patient is oriented to person, place and time.  Recent memory is intact.  Remote memory is intact.  Attention span and concentration are intact.  Expressive speech is intact.  Patient's fund of knowledge is within normal limits for educational level.  POSTURE/OBSERVATIONS:  Lumbar lordosis: diminished Thoracic  kyphosis: increased in length Iliac crest height: L elevated Lumbar lateral shift: negative Pelvic obliquity: L posterior  REFLEX TESTING: Clonus: Negative  GAIT: Trendelenburg R: Positive L: Positive; diminished pelvic rotation during gait; corrected with donning of heel lift. B Trendelenburg also eliminated with addition of R heel lift.  RANGE OF MOTION:    LEFT RIGHT  Lumbar forward flexion (65):  WFL    Lumbar extension (30): WFL    Lumbar lateral flexion (25):  [facility]  Thoracic and Lumbar rotation (30):    [facility] WFL  Hip Flexion (125):   WNL WNL  Hip IR (45):  WNL WNL  Hip ER  (45):  WNL WNL  Hip Abduction (40):  WNL WNL  Hip extension (15):  WNL WNL   SENSATION: Grossly intact to light touch bilateral LEs as determined by testing dermatomes L2-S2. Patient does note some diminished sensation with testing of L3 dermatome. Proprioception and hot/cold testing deferred on this date  STRENGTH: MMT   RLE LLE  Hip Flexion 5 4  Hip Abduction (seated) 4 4  Hip Adduction (seated) 4 4* tingle to the foot  Hip ER  4 4  Hip IR  3+ 3+  Knee Extension 5 5* along tibia  Knee Flexion 5 5* L foot tingly  Dorsiflexion  4 4  Plantarflexion (seated) 5 5   REPEATED MOVEMENTS: No centralization or peripheralization of symptoms with repeated lumbar extension or flexion.    MUSCLE LENGTH: Hamstring length: > 70 degrees B Quad length Pat Patrick): negative Hip Flexor length Marcello Moores): negative IT band length Nicoletta Dress): negative  PALPATION: TTP over L glute med, L piriformis/ middle fibers of glute max, L posterior sacral ligaments. Pulse palpable at dorsalis pedis, L.    SPECIAL TESTS SLR (SN 92, -LR 0.29): R: Negative L:  Negative FABER (SN 81): R: Negative L: Negative FADIR (SN 94): R: Negative L: Negative Distraction (DU20): R: Negative L: Negative Compression (SN/SP 69): R: Negative L: Negative  FUNCTIONAL TESTS: Sit to stand: Negative   ASSESSMENT Patient is a 41 year old presenting to clinic with chief complaints of persistent L hip and ankle pain. Upon examination, patient demonstrates deficits in L hip strength, pain, posture, and gait as evidenced by B Trendelenburg sign during gait with diminished pelvic rotation,elevation of L IC, L posterior rotation of ASIS, tenderness to palpation of L SIJ and L posterior sacral ligaments, excessive range of motion of B hips with gross 4/5 LLE strength. Patient's responses on FOTO outcome measures (62) indicate moderate functional limitations/disability/distress. Patient's progress may be limited due to persistent nature of complaint  and occupational demands; however, patient's motivation and positive response to heel lift in clinic today are advantageous. Patient was able to achieve basic understanding of pelvic posture/alignment interventions during today's evaluation and responded positively to manual interventions. Patient will benefit from continued skilled therapeutic intervention to address deficits in L hip strength, pain, posture, and gait in order to increase function and improve overall QOL.  EDUCATION Patient educated on prognosis, POC, and provided with HEP including: seated heel squeeze, seated pelvic MET. Patient articulated understanding and returned demonstration. Patient will benefit from further education in order to maximize compliance and understanding for long-term therapeutic gains.       Objective measurements completed on examination: See above findings.                     PT Long Term Goals - 02/13/21 1258       PT  LONG TERM GOAL #1   Title Patient will be independent with HEP in order to decrease hip pain and increase strength in order to improve pain-free function at home and work.    Baseline IE: initiated    Time 4    Period Weeks    Status New    Target Date 03/13/21      PT LONG TERM GOAL #2   Title Patient will demonstrate improved function as evidenced by a score of >74 on FOTO measure for full participation in activities at home and in the community.    Baseline IE: 27    Time 4    Period Weeks    Status New    Target Date 03/13/21      PT LONG TERM GOAL #3   Title Patient will decrease worst pain as reported on NPRS by at least 2 points to demonstrate clinically significant reduction in pain in order to restore/improve function and overall QOL.    Baseline IE: 10/10    Time 4    Period Weeks    Status New    Target Date 03/13/21      PT LONG TERM GOAL #4   Title Patient will improved LLE hip strength to 5/5 on MMT with pain at or below 3/10 on NPRS for  improved activity tolerance and participation in activities at home and in the community.    Baseline IE: 3+-4/5 LLE hip    Time 4    Period Weeks    Status New    Target Date 03/13/21      PT LONG TERM GOAL #5   Title Patient will be able to maintain neutral pelvic alignment in standing posture absent of rotation or IC elevation in order demonstrate improved postural control for activities at home and in the community.    Baseline IE: L posterior rotation, L IC elevation    Time 4    Period Weeks    Status New    Target Date 03/13/21                    Plan - 02/13/21 1157     Clinical Impression Statement Patient is a 41 year old presenting to clinic with chief complaints of persistent L hip and ankle pain. Upon examination, patient demonstrates deficits in L hip strength, pain, posture, and gait as evidenced by B Trendelenburg sign during gait with diminished pelvic rotation,elevation of L IC, L posterior rotation of ASIS, tenderness to palpation of L SIJ and L posterior sacral ligaments, excessive range of motion of B hips with gross 4/5 LLE strength. Patient's responses on FOTO outcome measures (62) indicate moderate functional limitations/disability/distress. Patient's progress may be limited due to persistent nature of complaint and occupational demands; however, patient's motivation and positive response to heel lift in clinic today are advantageous. Patient was able to achieve basic understanding of pelvic posture/alignment interventions during today's evaluation and responded positively to manual interventions. Patient will benefit from continued skilled therapeutic intervention to address deficits in L hip strength, pain, posture, and gait in order to increase function and improve overall QOL.    Personal Factors and Comorbidities Age;Comorbidity 1;Behavior Pattern;Time since onset of injury/illness/exacerbation;Past/Current Experience    Comorbidities UC     Examination-Activity Limitations Sit;Transfers;Lift;Squat;Stand    Examination-Participation Restrictions Community Activity;Occupation;Driving    Stability/Clinical Decision Making Stable/Uncomplicated    Clinical Decision Making Low    Rehab Potential Fair    PT Frequency 2x / week  PT Duration 4 weeks    PT Treatment/Interventions ADLs/Self Care Home Management;Cryotherapy;Electrical Stimulation;Moist Heat;Therapeutic exercise;Neuromuscular re-education;Patient/family education;Orthotic Fit/Training;Manual techniques;Taping;Spinal Manipulations;Joint Manipulations    PT Next Visit Plan postural interventions, manual PRN    PT Home Exercise Plan heel squeeze, Pelvic MET    Consulted and Agree with Plan of Care Patient             Patient will benefit from skilled therapeutic intervention in order to improve the following deficits and impairments:  Decreased endurance, Decreased activity tolerance, Decreased strength, Postural dysfunction, Pain, Improper body mechanics, Hypermobility  Visit Diagnosis: Pain in left hip  Muscle weakness (generalized)  Abnormal posture     Problem List Patient Active Problem List   Diagnosis Date Noted   Radicular pain of left lower extremity 12/26/2020   Impingement syndrome of right shoulder region 03/01/2019   Piriformis syndrome, left 10/26/2018   Muscle weakness 10/26/2018   Neuropathic pain 09/29/2018   Pain in left foot 09/11/2018   Ulcerative colitis ([place]) 11/08/2015    Myles Gip PT, DPT [phone]  02/13/2021, 6:04 PM  [facility] [address] [place], [place], [address] Phone: [phone]   Fax:  [phone]  Name: Michaela Cox MRN: [id number] Date of Birth: 1980-09-11

## 2021-02-22 ENCOUNTER — Ambulatory Visit: Payer: No Typology Code available for payment source | Admitting: Physical Therapy

## 2021-02-22 ENCOUNTER — Other Ambulatory Visit: Payer: Self-pay

## 2021-02-22 ENCOUNTER — Encounter: Payer: Self-pay | Admitting: Physical Therapy

## 2021-02-22 DIAGNOSIS — M6281 Muscle weakness (generalized): Secondary | ICD-10-CM

## 2021-02-22 DIAGNOSIS — R293 Abnormal posture: Secondary | ICD-10-CM

## 2021-02-22 DIAGNOSIS — M25552 Pain in left hip: Secondary | ICD-10-CM

## 2021-02-22 NOTE — Therapy (Signed)
[facility] [address] [place], [place], [address] Phone: [phone]   Fax:  [phone]  Physical Therapy Treatment  Patient Details  Name: Michaela Cox MRN: [id number] Date of Birth: 02-29-1980 Referring Provider (PT): Montel Culver   Encounter Date: 02/22/2021   PT End of Session - 02/22/21 1419     Visit Number 2    Number of Visits 9    Date for PT Re-Evaluation 03/13/21    Authorization Type IE 02/13/2021    PT Start Time 1415    PT Stop Time 1455    PT Time Calculation (min) 40 min    Activity Tolerance Patient tolerated treatment well    Behavior During Therapy [facility] for tasks assessed/performed             Past Medical History:  Diagnosis Date   Eustachian tube disorder    Ulcerative colitis ([place])     Past Surgical History:  Procedure Laterality Date   COLONOSCOPY      There were no vitals filed for this visit.   Subjective Assessment - 02/22/21 1418     Subjective Patient notes that she has had no significant changes or concerns since initial evaluation. Patient tired wearing heel lift, but had new onset of knee pain with prolonged walking and has discontinued use. Patient has been doing heel squeeze and pelvic MET with consistency but has not noted any significant change yet.    Currently in Pain? Yes    Pain Score 4     Pain Location Hip    Pain Orientation Left            TREATMENT  Manual Therapy: STM and TPR performed to L hip complex to allow for decreased tension and pain and improved posture and function Sacral and innominate mobilizations for improved pain and mobility, grade II/III  Neuromuscular Re-education: Sidelying L clamshell with postural feedback and concomitant TrA bracing for improved L hip motor control Sidelying L hip abduction at wall for postural feedback and concomitant TrA bracing for improved L hip motor control Quadruped TrA activation with coordinated breath  for improved postural control Quadruped TrA activation with coordinated breath and alternating UE reach for improved postural control Quadruped TrA activation with knee hover initiation for improved postural control   Patient educated throughout session on appropriate technique and form using multi-modal cueing, HEP, and activity modification. Patient articulated understanding and returned demonstration.  Patient Response to interventions: 4/10  ASSESSMENT Patient presents to clinic with excellent motivation to participate in therapy. Patient demonstrates deficits in L hip strength, pain, posture, and gait. Patient able to perform all exercises with no exacerbation of pain during today's session and denied any concerns with implementing new exercises as part of home program. Patient will benefit from continued skilled therapeutic intervention to address remaining deficits in L hip strength, pain, posture, and gait in order to increase function and improve overall QOL.    PT Long Term Goals - 02/13/21 1258       PT LONG TERM GOAL #1   Title Patient will be independent with HEP in order to decrease hip pain and increase strength in order to improve pain-free function at home and work.    Baseline IE: initiated    Time 4    Period Weeks    Status New    Target Date 03/13/21      PT LONG TERM GOAL #2   Title Patient will demonstrate improved function  as evidenced by a score of >74 on FOTO measure for full participation in activities at home and in the community.    Baseline IE: 1    Time 4    Period Weeks    Status New    Target Date 03/13/21      PT LONG TERM GOAL #3   Title Patient will decrease worst pain as reported on NPRS by at least 2 points to demonstrate clinically significant reduction in pain in order to restore/improve function and overall QOL.    Baseline IE: 10/10    Time 4    Period Weeks    Status New    Target Date 03/13/21      PT LONG TERM GOAL #4   Title  Patient will improved LLE hip strength to 5/5 on MMT with pain at or below 3/10 on NPRS for improved activity tolerance and participation in activities at home and in the community.    Baseline IE: 3+-4/5 LLE hip    Time 4    Period Weeks    Status New    Target Date 03/13/21      PT LONG TERM GOAL #5   Title Patient will be able to maintain neutral pelvic alignment in standing posture absent of rotation or IC elevation in order demonstrate improved postural control for activities at home and in the community.    Baseline IE: L posterior rotation, L IC elevation    Time 4    Period Weeks    Status New    Target Date 03/13/21                   Plan - 02/22/21 1419     Clinical Impression Statement Patient presents to clinic with excellent motivation to participate in therapy. Patient demonstrates deficits in L hip strength, pain, posture, and gait. Patient able to perform all exercises with no exacerbation of pain during today's session and denied any concerns with implementing new exercises as part of home program. Patient will benefit from continued skilled therapeutic intervention to address remaining deficits in L hip strength, pain, posture, and gait in order to increase function and improve overall QOL.    Personal Factors and Comorbidities Age;Comorbidity 1;Behavior Pattern;Time since onset of injury/illness/exacerbation;Past/Current Experience    Comorbidities UC    Examination-Activity Limitations Sit;Transfers;Lift;Squat;Stand    Examination-Participation Restrictions Community Activity;Occupation;Driving    Stability/Clinical Decision Making Stable/Uncomplicated    Rehab Potential Fair    PT Frequency 2x / week    PT Duration 4 weeks    PT Treatment/Interventions ADLs/Self Care Home Management;Cryotherapy;Electrical Stimulation;Moist Heat;Therapeutic exercise;Neuromuscular re-education;Patient/family education;Orthotic Fit/Training;Manual techniques;Taping;Spinal  Manipulations;Joint Manipulations    PT Next Visit Plan postural interventions, manual PRN    PT Home Exercise Plan heel squeeze, Pelvic MET    Consulted and Agree with Plan of Care Patient             Patient will benefit from skilled therapeutic intervention in order to improve the following deficits and impairments:  Decreased endurance, Decreased activity tolerance, Decreased strength, Postural dysfunction, Pain, Improper body mechanics, Hypermobility  Visit Diagnosis: Pain in left hip  Abnormal posture  Muscle weakness (generalized)     Problem List Patient Active Problem List   Diagnosis Date Noted   Radicular pain of left lower extremity 12/26/2020   Impingement syndrome of right shoulder region 03/01/2019   Piriformis syndrome, left 10/26/2018   Muscle weakness 10/26/2018   Neuropathic pain 09/29/2018   Pain in left  foot 09/11/2018   Ulcerative colitis ([place]) 11/08/2015    Myles Gip PT, DPT [phone]  02/22/2021, 6:21 PM  [facility] [address] [place], [place], [address] Phone: [phone]   Fax:  [phone]  Name: Michaela Cox MRN: [id number] Date of Birth: 11-17-1980

## 2021-02-26 ENCOUNTER — Ambulatory Visit: Payer: No Typology Code available for payment source | Admitting: Physical Therapy

## 2021-02-26 ENCOUNTER — Encounter: Payer: Self-pay | Admitting: Family Medicine

## 2021-02-26 ENCOUNTER — Other Ambulatory Visit: Payer: Self-pay | Admitting: Family Medicine

## 2021-02-26 ENCOUNTER — Encounter: Payer: Self-pay | Admitting: Physical Therapy

## 2021-02-26 ENCOUNTER — Other Ambulatory Visit: Payer: Self-pay

## 2021-02-26 DIAGNOSIS — M5410 Radiculopathy, site unspecified: Secondary | ICD-10-CM

## 2021-02-26 DIAGNOSIS — M25552 Pain in left hip: Secondary | ICD-10-CM | POA: Diagnosis not present

## 2021-02-26 DIAGNOSIS — R293 Abnormal posture: Secondary | ICD-10-CM

## 2021-02-26 DIAGNOSIS — M6281 Muscle weakness (generalized): Secondary | ICD-10-CM

## 2021-02-26 MED ORDER — GABAPENTIN 100 MG PO CAPS: ORAL_CAPSULE | ORAL | 0 refills | Status: DC

## 2021-02-26 NOTE — Therapy (Signed)
[facility] [address] [place], [place], [address] Phone: [phone]   Fax:  [phone]  Physical Therapy Treatment  Patient Details  Name: Michaela Cox MRN: [id number] Date of Birth: 1980/01/14 Referring Provider (PT): Montel Culver   Encounter Date: 02/26/2021   PT End of Session - 02/26/21 0950     Visit Number 3    Number of Visits 9    Date for PT Re-Evaluation 03/13/21    Authorization Type IE 02/13/2021    PT Start Time 0945    PT Stop Time 1025    PT Time Calculation (min) 40 min    Activity Tolerance Patient tolerated treatment well    Behavior During Therapy [facility] for tasks assessed/performed             Past Medical History:  Diagnosis Date   Eustachian tube disorder    Ulcerative colitis ([place])     Past Surgical History:  Procedure Laterality Date   COLONOSCOPY      There were no vitals filed for this visit.   Subjective Assessment - 02/26/21 0948     Subjective Patient reports that she ahd a busy weekend, but has noticed that her L hip/glute is improved. Her L ankle however feels about the same. Patient denies nay aggravation of pain from new HEP.    Currently in Pain? Yes    Pain Score 1     Pain Location Hip    Multiple Pain Sites Yes    Pain Score 4    Pain Location Ankle    Pain Orientation Left            TREATMENT  Manual Therapy: L ankle mobilizations in all directions including:   Talocrural, subtalar, and distal tib fib  Therapeutic Exercise: Seated Toe Towel Scrunches - 1-2 min hold Ankle Inversion Eversion Towel Slide - 1-2 min hold Standing Heel Raise with Toes Turned Out - 15-20 reps Standing Soleus Heel Raise with Toes turned out - 15-20 reps Seated Soleus Stretch - 30 sec-1 min hold Standing Gastroc Stretch - 30 sec - 1 min hold   Patient educated throughout session on appropriate technique and form using multi-modal cueing, HEP, and activity modification.  Patient articulated understanding and returned demonstration.  Patient Response to interventions: Notes increased mobility at L ankle after manual  ASSESSMENT Patient presents to clinic with excellent motivation to participate in therapy. Patient demonstrates deficits in L hip strength, pain, posture, and gait. Patient reporting improved L ankle mobility and sensation after manual interventions during today's session and responded positively to active interventions. Patient will benefit from continued skilled therapeutic intervention to address remaining deficits in L hip strength, pain, posture, and gait in order to increase function and improve overall QOL.    PT Long Term Goals - 02/13/21 1258       PT LONG TERM GOAL #1   Title Patient will be independent with HEP in order to decrease hip pain and increase strength in order to improve pain-free function at home and work.    Baseline IE: initiated    Time 4    Period Weeks    Status New    Target Date 03/13/21      PT LONG TERM GOAL #2   Title Patient will demonstrate improved function as evidenced by a score of >74 on FOTO measure for full participation in activities at home and in the community.    Baseline IE: 14  Time 4    Period Weeks    Status New    Target Date 03/13/21      PT LONG TERM GOAL #3   Title Patient will decrease worst pain as reported on NPRS by at least 2 points to demonstrate clinically significant reduction in pain in order to restore/improve function and overall QOL.    Baseline IE: 10/10    Time 4    Period Weeks    Status New    Target Date 03/13/21      PT LONG TERM GOAL #4   Title Patient will improved LLE hip strength to 5/5 on MMT with pain at or below 3/10 on NPRS for improved activity tolerance and participation in activities at home and in the community.    Baseline IE: 3+-4/5 LLE hip    Time 4    Period Weeks    Status New    Target Date 03/13/21      PT LONG TERM GOAL #5   Title  Patient will be able to maintain neutral pelvic alignment in standing posture absent of rotation or IC elevation in order demonstrate improved postural control for activities at home and in the community.    Baseline IE: L posterior rotation, L IC elevation    Time 4    Period Weeks    Status New    Target Date 03/13/21                   Plan - 02/26/21 0950     Clinical Impression Statement Patient presents to clinic with excellent motivation to participate in therapy. Patient demonstrates deficits in L hip strength, pain, posture, and gait. Patient reporting improved L ankle mobility and sensation after manual interventions during today's session and responded positively to active interventions. Patient will benefit from continued skilled therapeutic intervention to address remaining deficits in L hip strength, pain, posture, and gait in order to increase function and improve overall QOL.    Personal Factors and Comorbidities Age;Comorbidity 1;Behavior Pattern;Time since onset of injury/illness/exacerbation;Past/Current Experience    Comorbidities UC    Examination-Activity Limitations Sit;Transfers;Lift;Squat;Stand    Examination-Participation Restrictions Community Activity;Occupation;Driving    Stability/Clinical Decision Making Stable/Uncomplicated    Rehab Potential Fair    PT Frequency 2x / week    PT Duration 4 weeks    PT Treatment/Interventions ADLs/Self Care Home Management;Cryotherapy;Electrical Stimulation;Moist Heat;Therapeutic exercise;Neuromuscular re-education;Patient/family education;Orthotic Fit/Training;Manual techniques;Taping;Spinal Manipulations;Joint Manipulations    PT Next Visit Plan postural interventions, manual PRN    PT Home Exercise Plan Access Code: [email or web address]    Consulted and Agree with Plan of Care Patient             Patient will benefit from skilled therapeutic intervention in order to improve the following deficits and impairments:   Decreased endurance, Decreased activity tolerance, Decreased strength, Postural dysfunction, Pain, Improper body mechanics, Hypermobility  Visit Diagnosis: Pain in left hip  Abnormal posture  Muscle weakness (generalized)     Problem List Patient Active Problem List   Diagnosis Date Noted   Radicular pain of left lower extremity 12/26/2020   Impingement syndrome of right shoulder region 03/01/2019   Piriformis syndrome, left 10/26/2018   Muscle weakness 10/26/2018   Neuropathic pain 09/29/2018   Pain in left foot 09/11/2018   Ulcerative colitis ([place]) 11/08/2015    Myles Gip PT, DPT [phone]  02/26/2021, 1:06 PM  [facility] [address] [place], [place], [address] Phone: [phone]  Fax:  [phone]  Name: Michaela Cox MRN: [id number] Date of Birth: 08/22/1980

## 2021-02-28 ENCOUNTER — Encounter: Payer: Self-pay | Admitting: Physical Therapy

## 2021-03-05 ENCOUNTER — Encounter: Payer: Self-pay | Admitting: Physical Therapy

## 2021-03-05 ENCOUNTER — Ambulatory Visit: Payer: No Typology Code available for payment source | Admitting: Physical Therapy

## 2021-03-05 ENCOUNTER — Other Ambulatory Visit: Payer: Self-pay

## 2021-03-05 DIAGNOSIS — M25552 Pain in left hip: Secondary | ICD-10-CM

## 2021-03-05 DIAGNOSIS — M6281 Muscle weakness (generalized): Secondary | ICD-10-CM

## 2021-03-05 DIAGNOSIS — R293 Abnormal posture: Secondary | ICD-10-CM

## 2021-03-05 NOTE — Therapy (Signed)
[place] [facility] [address] [place], [place], [address] Phone: [phone]   Fax:  [phone]  Physical Therapy Treatment  Patient Details  Name: Michaela Cox MRN: [id number] Date of Birth: 03/22/1980 Referring Provider (PT): Montel Culver   Encounter Date: 03/05/2021   PT End of Session - 03/05/21 0950     Visit Number 4    Number of Visits 9    Date for PT Re-Evaluation 03/13/21    Authorization Type IE 02/13/2021    PT Start Time 0945    PT Stop Time 1025    PT Time Calculation (min) 40 min    Activity Tolerance Patient tolerated treatment well    Behavior During Therapy [facility] for tasks assessed/performed             Past Medical History:  Diagnosis Date   Eustachian tube disorder    Ulcerative colitis ([place])     Past Surgical History:  Procedure Laterality Date   COLONOSCOPY      There were no vitals filed for this visit.   Subjective Assessment - 03/05/21 0949     Subjective Patient denies any changes or concerns. Patient was able to walk with heel lift in shoe without any adverse effect. Patient notes everything seems the same and that nothing seems to be helping with respect to the ankle. Patient notes hip pain has stayed resolved.    Currently in Pain? Yes    Pain Score 4     Pain Location Ankle    Pain Score 0    Pain Location Hip            TREATMENT  Manual Therapy: L ankle mobilizations in all directions including:   Talocrural, subtalar, and distal tib fib  Neuromuscular Re-education: Reassessed goals; see below.   Patient educated throughout session on appropriate technique and form using multi-modal cueing, HEP, and activity modification. Patient articulated understanding and returned demonstration.  Patient Response to interventions: Comfortable to follow-up regarding L ankle if needed  ASSESSMENT Patient presents to clinic with excellent motivation to participate in therapy.  Patient demonstrates no remaining deficits in L hip strength, pain, and posture. Patient has achieved all goals set forth in physical therapy as indicated in goals section of note (see below/above depending on note format). Patient does now complain of L ankle pain which seems to be more likely related to the joint than surrounding muscular structures. We discussed following-up with MD regarding ankle and returning to clinic for further PT focused on ankle if advised. At this time patient is appropriate for trial discharge period for the continued management of L hip.   PT Long Term Goals - 03/05/21 0955       PT LONG TERM GOAL #1   Title Patient will be independent with HEP in order to decrease hip pain and increase strength in order to improve pain-free function at home and work.    Baseline IE: initiated; 2/27: IND    Time 4    Period Weeks    Status Achieved    Target Date 03/13/21      PT LONG TERM GOAL #2   Title Patient will demonstrate improved function as evidenced by a score of >74 on FOTO measure for full participation in activities at home and in the community.    Baseline IE: 62; 2/27: 87    Time 4    Period Weeks    Status Achieved    Target Date  03/13/21      PT LONG TERM GOAL #3   Title Patient will decrease worst pain as reported on NPRS by at least 2 points to demonstrate clinically significant reduction in pain in order to restore/improve function and overall QOL.    Baseline IE: 10/10; 2/27: 1-2/10    Time 4    Period Weeks    Status Achieved    Target Date 03/13/21      PT LONG TERM GOAL #4   Title Patient will improved LLE hip strength to 5/5 on MMT with pain at or below 3/10 on NPRS for improved activity tolerance and participation in activities at home and in the community.    Baseline IE: 3+-4/5 LLE hip; 2/27: 5/5 LLE, 0/10    Time 4    Period Weeks    Status Achieved    Target Date 03/13/21      PT LONG TERM GOAL #5   Title Patient will be able to  maintain neutral pelvic alignment in standing posture absent of rotation or IC elevation in order demonstrate improved postural control for activities at home and in the community.    Baseline IE: L posterior rotation, L IC elevation; 2/27: roughly symmetrical and neutral    Time 4    Period Weeks    Status Achieved    Target Date 03/13/21                   Plan - 03/05/21 0951     Clinical Impression Statement Patient presents to clinic with excellent motivation to participate in therapy. Patient demonstrates no remaining deficits in L hip strength, pain, and posture. Patient has achieved all goals set forth in physical therapy as indicated in goals section of note (see below/above depending on note format). Patient does now complain of L ankle pain which seems to be more likely related to the joint than surrounding muscular structures. We discussed following-up with MD regarding ankle and returning to clinic for further PT focused on ankle if advised. At this time patient is appropriate for trial discharge period for the continued management of L hip.    Personal Factors and Comorbidities Age;Comorbidity 1;Behavior Pattern;Time since onset of injury/illness/exacerbation;Past/Current Experience    Comorbidities UC    Examination-Activity Limitations Sit;Transfers;Lift;Squat;Stand    Examination-Participation Restrictions Community Activity;Occupation;Driving    Stability/Clinical Decision Making Stable/Uncomplicated    Rehab Potential Fair    PT Frequency 2x / week    PT Duration 4 weeks    PT Treatment/Interventions ADLs/Self Care Home Management;Cryotherapy;Electrical Stimulation;Moist Heat;Therapeutic exercise;Neuromuscular re-education;Patient/family education;Orthotic Fit/Training;Manual techniques;Taping;Spinal Manipulations;Joint Manipulations    PT Next Visit Plan --    PT Home Exercise Plan Access Code: [email or web address]    Consulted and Agree with Plan of Care Patient              Patient will benefit from skilled therapeutic intervention in order to improve the following deficits and impairments:  Decreased endurance, Decreased activity tolerance, Decreased strength, Postural dysfunction, Pain, Improper body mechanics, Hypermobility  Visit Diagnosis: Pain in left hip  Muscle weakness (generalized)  Abnormal posture     Problem List Patient Active Problem List   Diagnosis Date Noted   Radicular pain of left lower extremity 12/26/2020   Impingement syndrome of right shoulder region 03/01/2019   Piriformis syndrome, left 10/26/2018   Muscle weakness 10/26/2018   Neuropathic pain 09/29/2018   Pain in left foot 09/11/2018   Ulcerative colitis ([place]) 11/08/2015    Myles Gip PT,  DPT [id number]  03/05/2021, 1:18 PM  [place] [facility] [address] [place], [place], [address] Phone: [phone]   Fax:  [phone]  Name: Michaela Cox MRN: [id number] Date of Birth: 09/11/1980

## 2021-03-07 ENCOUNTER — Encounter: Payer: Self-pay | Admitting: Physical Therapy

## 2021-03-12 ENCOUNTER — Encounter: Payer: Self-pay | Admitting: Physical Therapy

## 2021-03-14 ENCOUNTER — Encounter: Payer: Self-pay | Admitting: Physical Therapy

## 2021-03-14 ENCOUNTER — Other Ambulatory Visit: Payer: Self-pay | Admitting: Family Medicine

## 2021-03-14 DIAGNOSIS — M5410 Radiculopathy, site unspecified: Secondary | ICD-10-CM

## 2021-03-15 NOTE — Telephone Encounter (Signed)
Requested medication (s) are due for refill today:   Yes ? ?Requested medication (s) are on the active medication list:   Yes ? ?Future visit scheduled:   Yes ? ? ?Last ordered: Returned because there isn't a Cr. Result.   Protocol criteria not met.    ? ?Requested Prescriptions  ?Pending Prescriptions Disp Refills  ? gabapentin (NEURONTIN) 100 MG capsule [Pharmacy Med Name: GABAPENTIN 100 MG CAPSULE] 90 capsule 0  ?  Sig: 3 CAPSULES AT NIGHT, CAN INCREASE BY 1 CAP WEEKLY UNTIL SYMPTOMS CONTROLLED MAX 6 CAPSULES AT NIGHT.  ?  ? Neurology: Anticonvulsants - gabapentin Failed - 03/14/2021 11:44 AM  ?  ?  Failed - Cr in normal range and within 360 days  ?  No results found for: CREATININE, LABCREAU, LABCREA, POCCRE  ?  ?  ?  Passed - Completed PHQ-2 or PHQ-9 in the last 360 days  ?  ?  Passed - Valid encounter within last 12 months  ?  Recent Outpatient Visits   ? ?      ? 1 month ago Piriformis syndrome, left  ? [facility] Montel Culver, MD  ? 1 month ago Piriformis syndrome, left  ? [facility] Montel Culver, MD  ? 2 months ago Piriformis syndrome, left  ? [facility] Montel Culver, MD  ? ?  ?  ?Future Appointments   ? ?        ? In 1 week Zigmund Daniel Earley Abide, MD [facility] [place]  ? ?  ? ?  ?  ?  ? ?

## 2021-03-19 ENCOUNTER — Encounter: Payer: Self-pay | Admitting: Physical Therapy

## 2021-03-21 ENCOUNTER — Encounter: Payer: Self-pay | Admitting: Physical Therapy

## 2021-03-22 ENCOUNTER — Encounter: Payer: Self-pay | Admitting: Family Medicine

## 2021-03-22 ENCOUNTER — Ambulatory Visit (INDEPENDENT_AMBULATORY_CARE_PROVIDER_SITE_OTHER): Payer: No Typology Code available for payment source | Admitting: Family Medicine

## 2021-03-22 ENCOUNTER — Other Ambulatory Visit: Payer: Self-pay

## 2021-03-22 VITALS — BP 126/82 | HR 71 | Ht 63.0 in | Wt 149.6 lb

## 2021-03-22 DIAGNOSIS — M722 Plantar fascial fibromatosis: Secondary | ICD-10-CM | POA: Diagnosis not present

## 2021-03-22 DIAGNOSIS — M26623 Arthralgia of bilateral temporomandibular joint: Secondary | ICD-10-CM | POA: Insufficient documentation

## 2021-03-22 DIAGNOSIS — H9203 Otalgia, bilateral: Secondary | ICD-10-CM | POA: Diagnosis not present

## 2021-03-22 DIAGNOSIS — G5702 Lesion of sciatic nerve, left lower limb: Secondary | ICD-10-CM | POA: Diagnosis not present

## 2021-03-22 DIAGNOSIS — M7672 Peroneal tendinitis, left leg: Secondary | ICD-10-CM | POA: Insufficient documentation

## 2021-03-22 HISTORY — DX: Peroneal tendinitis, left leg: M76.72

## 2021-03-22 HISTORY — DX: Plantar fascial fibromatosis: M72.2

## 2021-03-22 NOTE — Patient Instructions (Addendum)
-   Gradually wean and discontinue gabapentin ?- Continue with home exercises for the left hip as instructed by PT ?- Start new home exercises for the left foot/ankle ?- If necessary, utilize PT referral for formal oversight ?- Referral coordinator will contact you in regards to ENT scheduling ?- Return for follow-up in 8 weeks for annual physical ?

## 2021-03-22 NOTE — Assessment & Plan Note (Signed)
Patient has had remitting and relapsing bilateral ear pain, denies any congestion, oropharyngeal pain during these episodes, the sinus pressure noted.  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  From examination standpoint tympanic membrane's bilaterally are benign, clear canals, oropharynx and nasopharynx benign, nontender sinuses, no tragal manipulation tenderness, mild tenderness around the left posterior ear track along the paraspinal cervical musculature. ? ?Given the chronicity of symptoms, ENT referral indicated, our office is placed 1 today for further evaluation and management. ?

## 2021-03-22 NOTE — Assessment & Plan Note (Signed)
See additional assessment(s) for plan details. 

## 2021-03-22 NOTE — Assessment & Plan Note (Signed)
Chronic condition that is demonstrated excellent improvement following formal physical therapy and medications.  Her examination reveals minimally positive piriformis stretching and FABER, negative straight leg raise, overall improved.  I have advised wean from medications from a management standpoint, continued home exercise to further advance her progress, and she can follow-up on as-needed basis for this issue. ?

## 2021-03-22 NOTE — Progress Notes (Signed)
?  ? ?  Primary Care / Sports Medicine Office Visit ? ?Patient Information:  ?Patient ID: Michaela Cox, female DOB: January 10, 1980 Age: 41 y.o. MRN: [id number]  ? ?Michaela Cox is a pleasant 41 y.o. female presenting with the following: ? ?Chief Complaint  ?Patient presents with  ? Follow-up  ?  Left hip-feeling better ?Left foot-still pretty painful all time, for 2 years, medication is not helping her foot  ? ? ?Vitals:  ? 03/22/21 1313  ?BP: 126/82  ?Pulse: 71  ?SpO2: 99%  ? ?Vitals:  ? 03/22/21 1313  ?Weight: 149 lb 9.6 oz (67.9 kg)  ?Height: 5\' 3"  (1.6 m)  ? ?Body mass index is 26.5 kg/m?. ? ?No results found.  ? ?Independent interpretation of notes and tests performed by another provider:  ? ?None ? ?Procedures performed:  ? ?None ? ?Pertinent History, Exam, Impression, and Recommendations:  ? ?Peroneal tendinitis, left ?Given patient's persistent left foot symptoms despite near resolution of ipsilateral left hip (piriformis syndrome/sciatica), evaluation of left foot/ankle reveals focal tenderness about the peroneal tendons at their insertion.  She secondarily has plantar fascia tenderness at the calcaneus and midfoot arch.  I have advised a home-based rehab program, as needed usage of lace up ASO brace. ? ?Chronic condition, symptomatic, Rx management ? ?Piriformis syndrome, left ?Chronic condition that is demonstrated excellent improvement following formal physical therapy and medications.  Her examination reveals minimally positive piriformis stretching and FABER, negative straight leg raise, overall improved.  I have advised wean from medications from a management standpoint, continued home exercise to further advance her progress, and she can follow-up on as-needed basis for this issue. ? ?Plantar fasciitis, left ?See additional assessment(s) for plan details. ? ?Chronic ear pain, bilateral ?Patient has had remitting and relapsing bilateral ear pain, denies any congestion, oropharyngeal pain during these  episodes, the sinus pressure noted.  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  From examination standpoint tympanic membrane's bilaterally are benign, clear canals, oropharynx and nasopharynx benign, nontender sinuses, no tragal manipulation tenderness, mild tenderness around the left posterior ear track along the paraspinal cervical musculature. ? ?Given the chronicity of symptoms, ENT referral indicated, our office is placed 1 today for further evaluation and management.  ? ?Orders & Medications ?No orders of the defined types were placed in this encounter. ? ?Orders Placed This Encounter  ?Procedures  ? Ambulatory referral to ENT  ? Ambulatory referral to Physical Therapy  ?  ? ?Return in about 8 weeks (around 05/17/2021) for Annual Physical.  ?  ? ?Montel Culver, MD ? ? Primary Care Sports Medicine ?[facility] ?[place]  ? ?

## 2021-03-22 NOTE — Assessment & Plan Note (Addendum)
Given patient's persistent left foot symptoms despite near resolution of ipsilateral left hip (piriformis syndrome/sciatica), evaluation of left foot/ankle reveals focal tenderness about the peroneal tendons at their insertion.  She secondarily has plantar fascia tenderness at the calcaneus and midfoot arch.  I have advised a home-based rehab program, as needed usage of lace up ASO brace. ? ?Chronic condition, symptomatic, Rx management ?

## 2021-03-26 ENCOUNTER — Encounter: Payer: Self-pay | Admitting: Physical Therapy

## 2021-03-28 ENCOUNTER — Encounter: Payer: Self-pay | Admitting: Physical Therapy

## 2021-04-02 ENCOUNTER — Encounter: Payer: Self-pay | Admitting: Physical Therapy

## 2021-04-04 ENCOUNTER — Encounter: Payer: Self-pay | Admitting: Physical Therapy

## 2021-05-17 ENCOUNTER — Encounter: Payer: Self-pay | Admitting: Family Medicine

## 2021-05-17 ENCOUNTER — Ambulatory Visit (INDEPENDENT_AMBULATORY_CARE_PROVIDER_SITE_OTHER): Payer: No Typology Code available for payment source | Admitting: Family Medicine

## 2021-05-17 VITALS — BP 126/86 | HR 86 | Ht 63.0 in | Wt 147.4 lb

## 2021-05-17 DIAGNOSIS — Z0000 Encounter for general adult medical examination without abnormal findings: Secondary | ICD-10-CM | POA: Diagnosis not present

## 2021-05-17 DIAGNOSIS — M7672 Peroneal tendinitis, left leg: Secondary | ICD-10-CM

## 2021-05-17 DIAGNOSIS — M26623 Arthralgia of bilateral temporomandibular joint: Secondary | ICD-10-CM

## 2021-05-17 DIAGNOSIS — G5702 Lesion of sciatic nerve, left lower limb: Secondary | ICD-10-CM

## 2021-05-17 DIAGNOSIS — M722 Plantar fascial fibromatosis: Secondary | ICD-10-CM

## 2021-05-17 DIAGNOSIS — Z1159 Encounter for screening for other viral diseases: Secondary | ICD-10-CM

## 2021-05-17 DIAGNOSIS — M5410 Radiculopathy, site unspecified: Secondary | ICD-10-CM

## 2021-05-17 DIAGNOSIS — Z114 Encounter for screening for human immunodeficiency virus [HIV]: Secondary | ICD-10-CM

## 2021-05-17 DIAGNOSIS — K51919 Ulcerative colitis, unspecified with unspecified complications: Secondary | ICD-10-CM

## 2021-05-17 DIAGNOSIS — R7989 Other specified abnormal findings of blood chemistry: Secondary | ICD-10-CM

## 2021-05-17 DIAGNOSIS — Z13220 Encounter for screening for lipoid disorders: Secondary | ICD-10-CM | POA: Diagnosis not present

## 2021-05-17 MED ORDER — GABAPENTIN 100 MG PO CAPS
ORAL_CAPSULE | ORAL | 3 refills | Status: DC
Start: 2021-05-17 — End: 2021-06-28

## 2021-05-17 MED ORDER — DULOXETINE HCL 30 MG PO CPEP: ORAL_CAPSULE | ORAL | 0 refills | Status: DC

## 2021-05-17 NOTE — Assessment & Plan Note (Signed)
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  We will continue to follow this issue peripherally. ?

## 2021-05-17 NOTE — Patient Instructions (Addendum)
-   Obtain fasting labs with orders provided (can have water or black coffee but otherwise no food or drink x 8 hours before labs) ?- Restart gabapentin nightly at 100 mg, increase by 100 mg every week to target a range between 300-600 mg (lowest dose that adequately controls symptoms) ?- Start Cymbalta (duloxetine) 30 mg nightly, after 2 weeks increase to 60 mg nightly (2 capsules) and remain on this dose until follow-up ?- Referral coordinator will contact in regards to scheduling physical therapy for the left hip and left ankle as well as gastroenterology ?- Review information provided ?- Attend eye doctor annually, dentist every 6 months, work towards or maintain 30 minutes of moderate intensity physical activity at least 5 days per week, and consume a balanced diet ?- Return in 1 year for physical ?- Contact us for any questions between now and then ?

## 2021-05-17 NOTE — Assessment & Plan Note (Signed)
Chronic issue with ongoing symptomatology.  At last visit she was noted to have focal tenderness and symptomatology that localized to the peroneal tendons on the left.  Unfortunately, due to life obligations/time commitments, she was unable to proceed with physical therapy but is amenable to do so given her persistent symptomatology and findings today.  From a medication management standpoint, she did note worsening symptoms after discontinuation of gabapentin raising concern for superimposed neuropathic involvement, plan to restart gabapentin, initiate adjunct duloxetine for musculoskeletal pain, we will coordinate a follow-up in 2 months. ?

## 2021-05-17 NOTE — Progress Notes (Signed)
?  ? ?Annual Physical Exam Visit ? ?Patient Information:  ?Patient ID: Michaela Cox, female DOB: Dec 05, 1980 Age: 41 y.o. MRN: [id number]  ? ?Subjective:  ? ?CC: Annual Physical Exam ? ?HPI:  ?Michaela Cox is here for their annual physical. ? ?I reviewed the past medical history, family history, social history, surgical history, and allergies today and changes were made as necessary.  Please see the problem list section below for additional details. ? ?Past Medical History: ?Past Medical History:  ?Diagnosis Date  ? Eustachian tube disorder   ? Ulcerative colitis (HCC)   ? ?Past Surgical History: ?Past Surgical History:  ?Procedure Laterality Date  ? COLONOSCOPY  12/2016  ? ?Family History: ?Family History  ?Problem Relation Age of Onset  ? Cancer Mother   ?     gallbladder  ? Liver disease Mother   ? GER disease Sister   ? Asthma Daughter   ? Lung cancer Maternal Aunt   ? Hypertension Maternal Grandmother   ? Lung cancer Maternal Grandfather   ? Hypertension Paternal Grandfather   ? Diabetes Paternal Grandfather   ? ?Allergies: ?No Known Allergies ?Health Maintenance: ?Health Maintenance  ?Topic Date Due  ? Hepatitis C Screening  Never done  ? COVID-19 Vaccine (3 - Booster for Pfizer series) 07/29/2019  ? INFLUENZA VACCINE  08/07/2021  ? PAP SMEAR-Modifier  09/20/2021  ? TETANUS/TDAP  06/16/2023  ? HIV Screening  Completed  ? HPV VACCINES  Aged Out  ?  ?HM Colonoscopy   ? ? This patient has no relevant Health Maintenance data.  ? ?  ? ?Medications: ?Current Outpatient Medications on File Prior to Visit  ?Medication Sig Dispense Refill  ? Multiple Vitamin (ONE DAILY) tablet Take 1 tablet by mouth daily.    ? Turmeric (QC TUMERIC COMPLEX PO) Take by mouth.    ? ?No current facility-administered medications on file prior to visit.  ? ? ?Review of Systems: No headache, visual changes, nausea, vomiting, diarrhea, constipation, dizziness, abdominal pain, skin rash, fevers, chills, night sweats, swollen lymph nodes,  weight loss, chest pain, body aches, joint swelling, +muscle aches, shortness of breath, mood changes, visual or auditory hallucinations reported. ? ?Objective:  ? ?Vitals:  ? 05/17/21 0857  ?BP: 126/86  ?Pulse: 86  ?SpO2: 99%  ? ?Vitals:  ? 05/17/21 0857  ?Weight: 147 lb 6.4 oz (66.9 kg)  ?Height: 5\' 3"  (1.6 m)  ? ?Body mass index is 26.11 kg/m?. ? ?General: Well Developed, well nourished, and in no acute distress.  ?Neuro: Alert and oriented x3, extra-ocular muscles intact, sensation grossly intact. Cranial nerves II through XII are grossly intact, motor, sensory, and coordinative functions are intact. ?HEENT: Normocephalic, atraumatic, pupils equal round reactive to light, neck supple, no masses, no lymphadenopathy, thyroid nonpalpable. Oropharynx, nasopharynx, external ear canals are unremarkable. ?Skin: Warm and dry, no rashes noted.  ?Cardiac: Regular rate and rhythm, no murmurs rubs or gallops. No peripheral edema. Pulses symmetric. ?Respiratory: Clear to auscultation bilaterally. Not using accessory muscles, speaking in full sentences.  ?Abdominal: Soft, nontender, nondistended, positive bowel sounds, no masses, no organomegaly. ?Musculoskeletal: Shoulder, elbow, wrist, hip, knee, ankle stable, and with full range of motion.  Tenderness tracking at the left peroneal tendons about the lateral malleolus ? ?Female chaperone initials: KH present throughout the physical examination. ? ?Impression and Recommendations:  ? ?The patient was counselled, risk factors were discussed, and anticipatory guidance given. ? ?Problem List Items Addressed This Visit   ? ?  ? Digestive  ? Ulcerative  colitis (HCC)  ?  Has previously established with [facility] gastroenterology group, desires group locally.  Did have colonoscopy 5 years prior and states that she is due for one.  We will place referral to local gastroenterology group for reestablishment of care, they can coordinate appropriate work-up accordingly. ? ?  ?  ? Relevant  Orders  ? Ambulatory referral to Gastroenterology  ?  ? Nervous and Auditory  ? Piriformis syndrome, left - Primary  ?  Chronic issue with ongoing symptomatology, while she did note interval improvement from physical therapy, does continue home exercises, discontinue gabapentin as noted progressively worsening symptomatology.  At this stage have advised a restart, in a titrating manner, gabapentin with a target range of 300-600 mg nightly, adjunct duloxetine titrating to 60 mg, and return to physical therapy.  We will reassess her symptomatology at her return in 2 months. ? ?  ?  ? Relevant Medications  ? DULoxetine (CYMBALTA) 30 MG capsule  ? gabapentin (NEURONTIN) 100 MG capsule  ? Other Relevant Orders  ? Ambulatory referral to Physical Therapy  ?  ? Musculoskeletal and Integument  ? Peroneal tendinitis, left  ?  Chronic issue with ongoing symptomatology.  At last visit she was noted to have focal tenderness and symptomatology that localized to the peroneal tendons on the left.  Unfortunately, due to life obligations/time commitments, she was unable to proceed with physical therapy but is amenable to do so given her persistent symptomatology and findings today.  From a medication management standpoint, she did note worsening symptoms after discontinuation of gabapentin raising concern for superimposed neuropathic involvement, plan to restart gabapentin, initiate adjunct duloxetine for musculoskeletal pain, we will coordinate a follow-up in 2 months. ? ?  ?  ? Relevant Medications  ? DULoxetine (CYMBALTA) 30 MG capsule  ? gabapentin (NEURONTIN) 100 MG capsule  ? Other Relevant Orders  ? Ambulatory referral to Physical Therapy  ? Plantar fasciitis, left  ?  Physical therapy to work on this issue as well. ? ?  ?  ? Relevant Orders  ? Ambulatory referral to Physical Therapy  ?  ? Other  ? Radicular pain of left lower extremity  ?  Chronic issue that progressively worsened following discontinuation of gabapentin, plan  to restart gabapentin as well as incorporate adjunct duloxetine. ? ?  ?  ? Relevant Medications  ? DULoxetine (CYMBALTA) 30 MG capsule  ? gabapentin (NEURONTIN) 100 MG capsule  ? TMJ tenderness, bilateral  ?  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  We will continue to follow this issue peripherally. ? ?  ?  ? Annual physical exam  ? Relevant Orders  ? Apo A1 + B + Ratio  ? CBC  ? Comprehensive metabolic panel  ? Hepatitis C antibody  ? VITAMIN D 25 Hydroxy (Vit-D Deficiency, Fractures)  ? TSH  ? Lipid panel  ? HIV Antibody (routine testing w rflx)  ? ?Other Visit Diagnoses   ? ? Screening for HIV (human immunodeficiency virus)      ? Relevant Orders  ? HIV Antibody (routine testing w rflx)  ? Need for hepatitis C screening test      ? Relevant Orders  ? Hepatitis C antibody  ? Screening for lipoid disorders      ? Relevant Orders  ? Apo A1 + B + Ratio  ?  Lipid panel  ? Low serum vitamin D      ? Relevant Orders  ? VITAMIN D 25 Hydroxy (Vit-D Deficiency, Fractures)  ? ?  ?  ? ?Orders & Medications ?Medications:  ?Meds ordered this encounter  ?Medications  ? DULoxetine (CYMBALTA) 30 MG capsule  ?  Sig: Take 1 capsule (30 mg total) by mouth every evening for 14 days, THEN 2 capsules (60 mg total) every evening.  ?  Dispense:  106 capsule  ?  Refill:  0  ? gabapentin (NEURONTIN) 100 MG capsule  ?  Sig: One cap PO qHS for a week, then 2 caps PO qHS for a week, continue to increase weekly until symptoms controlle,d do not exceed 6 caps qHS  ?  Dispense:  90 capsule  ?  Refill:  3  ? ?Orders Placed This Encounter  ?Procedures  ? Apo A1 + B + Ratio  ? CBC  ? Comprehensive metabolic panel  ? Hepatitis C antibody  ? VITAMIN D 25 Hydroxy (Vit-D Deficiency, Fractures)  ? TSH  ? Lipid panel  ? HIV Antibody (routine testing w rflx)  ? Ambulatory referral  to Gastroenterology  ? Ambulatory referral to Physical Therapy  ?  ? ?Return in about 2 months (around 07/17/2021) for Left hip / ankle.  ? ? ?Jerrol BananaJason J Danel Studzinski, MD ? ? Primary Care Sports Medicine ?[place]

## 2021-05-17 NOTE — Assessment & Plan Note (Signed)
Chronic issue that progressively worsened following discontinuation of gabapentin, plan to restart gabapentin as well as incorporate adjunct duloxetine. ?

## 2021-05-17 NOTE — Assessment & Plan Note (Signed)
Physical therapy to work on this issue as well. ?

## 2021-05-17 NOTE — Assessment & Plan Note (Signed)
Chronic issue with ongoing symptomatology, while she did note interval improvement from physical therapy, does continue home exercises, discontinue gabapentin as noted progressively worsening symptomatology.  At this stage have advised a restart, in a titrating manner, gabapentin with a target range of 300-600 mg nightly, adjunct duloxetine titrating to 60 mg, and return to physical therapy.  We will reassess her symptomatology at her return in 2 months. ?

## 2021-05-17 NOTE — Assessment & Plan Note (Addendum)
Has previously established with [facility] gastroenterology group, desires group locally.  Did have colonoscopy 5 years prior and states that she is due for one.  We will place referral to local gastroenterology group for reestablishment of care, they can coordinate appropriate work-up accordingly. ?

## 2021-05-18 ENCOUNTER — Other Ambulatory Visit: Payer: Self-pay | Admitting: Family Medicine

## 2021-05-18 DIAGNOSIS — R7989 Other specified abnormal findings of blood chemistry: Secondary | ICD-10-CM

## 2021-05-18 LAB — COMPREHENSIVE METABOLIC PANEL
ALT: 18 IU/L (ref 0–32)
AST: 21 IU/L (ref 0–40)
Albumin/Globulin Ratio: 1.9 (ref 1.2–2.2)
Albumin: 4.5 g/dL (ref 3.8–4.8)
Alkaline Phosphatase: 66 IU/L (ref 44–121)
BUN/Creatinine Ratio: 15 (ref 9–23)
BUN: 11 mg/dL (ref 6–24)
Bilirubin Total: 0.3 mg/dL (ref 0.0–1.2)
CO2: 24 mmol/L (ref 20–29)
Calcium: 9.5 mg/dL (ref 8.7–10.2)
Chloride: 102 mmol/L (ref 96–106)
Creatinine, Ser: 0.75 mg/dL (ref 0.57–1.00)
Globulin, Total: 2.4 g/dL (ref 1.5–4.5)
Glucose: 79 mg/dL (ref 70–99)
Potassium: 4.1 mmol/L (ref 3.5–5.2)
Sodium: 140 mmol/L (ref 134–144)
Total Protein: 6.9 g/dL (ref 6.0–8.5)
eGFR: 103 mL/min/{1.73_m2} (ref >59)

## 2021-05-18 LAB — LIPID PANEL
Chol/HDL Ratio: 3 ratio (ref 0.0–4.4)
Cholesterol, Total: 216 mg/dL — ABNORMAL HIGH (ref 100–199)
HDL: 73 mg/dL (ref >39)
LDL Chol Calc (NIH): 131 mg/dL — ABNORMAL HIGH (ref 0–99)
Triglycerides: 66 mg/dL (ref 0–149)
VLDL Cholesterol Cal: 12 mg/dL (ref 5–40)

## 2021-05-18 LAB — CBC
Hematocrit: 40.8 % (ref 34.0–46.6)
Hemoglobin: 13.3 g/dL (ref 11.1–15.9)
MCH: 31.1 pg (ref 26.6–33.0)
MCHC: 32.6 g/dL (ref 31.5–35.7)
MCV: 95 fL (ref 79–97)
Platelets: 231 10*3/uL (ref 150–450)
RBC: 4.28 x10E6/uL (ref 3.77–5.28)
RDW: 12.2 % (ref 11.7–15.4)
WBC: 5.4 10*3/uL (ref 3.4–10.8)

## 2021-05-18 LAB — HEPATITIS C ANTIBODY: Hep C Virus Ab: NONREACTIVE

## 2021-05-18 LAB — VITAMIN D 25 HYDROXY (VIT D DEFICIENCY, FRACTURES): Vit D, 25-Hydroxy: 29 ng/mL — ABNORMAL LOW (ref 30.0–100.0)

## 2021-05-18 LAB — APO A1 + B + RATIO
Apolipo. B/A-1 Ratio: 0.6 ratio (ref 0.0–0.6)
Apolipoprotein A-1: 153 mg/dL (ref 116–209)
Apolipoprotein B: 99 mg/dL — ABNORMAL HIGH (ref <90)

## 2021-05-18 LAB — TSH: TSH: 1.36 u[IU]/mL (ref 0.450–4.500)

## 2021-05-18 LAB — HIV ANTIBODY (ROUTINE TESTING W REFLEX): HIV Screen 4th Generation wRfx: NONREACTIVE

## 2021-05-18 MED ORDER — VITAMIN D (ERGOCALCIFEROL) 1.25 MG (50000 UNIT) PO CAPS: 50000.0000 [IU] | ORAL_CAPSULE | ORAL | 0 refills | Status: DC

## 2021-06-11 ENCOUNTER — Ambulatory Visit: Payer: No Typology Code available for payment source | Attending: Family Medicine | Admitting: Physical Therapy

## 2021-06-11 ENCOUNTER — Encounter: Payer: Self-pay | Admitting: Physical Therapy

## 2021-06-11 DIAGNOSIS — M722 Plantar fascial fibromatosis: Secondary | ICD-10-CM | POA: Diagnosis not present

## 2021-06-11 DIAGNOSIS — G5702 Lesion of sciatic nerve, left lower limb: Secondary | ICD-10-CM | POA: Diagnosis not present

## 2021-06-11 DIAGNOSIS — M7672 Peroneal tendinitis, left leg: Secondary | ICD-10-CM | POA: Insufficient documentation

## 2021-06-11 DIAGNOSIS — M6281 Muscle weakness (generalized): Secondary | ICD-10-CM | POA: Diagnosis present

## 2021-06-11 DIAGNOSIS — R262 Difficulty in walking, not elsewhere classified: Secondary | ICD-10-CM | POA: Insufficient documentation

## 2021-06-11 DIAGNOSIS — M25572 Pain in left ankle and joints of left foot: Secondary | ICD-10-CM | POA: Diagnosis present

## 2021-06-11 NOTE — Therapy (Signed)
OUTPATIENT PHYSICAL THERAPY LOWER EXTREMITY EVALUATION   Patient Name: Michaela Cox MRN: [id number] DOB:1980/11/03, 41 y.o., female Today's Date: 06/11/2021   PT End of Session - 06/11/21 0858     Visit Number 1    Number of Visits 8    Date for PT Re-Evaluation 08/06/21    Authorization Type IE 06/11/2021    PT Start Time 0900    PT Stop Time 0940    PT Time Calculation (min) 40 min    Activity Tolerance Patient tolerated treatment well    Behavior During Therapy [facility] for tasks assessed/performed             Past Medical History:  Diagnosis Date   Eustachian tube disorder    Ulcerative colitis ([place])    Past Surgical History:  Procedure Laterality Date   COLONOSCOPY  12/2016   Patient Active Problem List   Diagnosis Date Noted   Annual physical exam 05/17/2021   TMJ tenderness, bilateral 03/22/2021   Peroneal tendinitis, left 03/22/2021   Plantar fasciitis, left 03/22/2021   Radicular pain of left lower extremity 12/26/2020   Impingement syndrome of right shoulder region 03/01/2019   Piriformis syndrome, left 10/26/2018   Muscle weakness 10/26/2018   Neuropathic pain 09/29/2018   Pain in left foot 09/11/2018   Ulcerative colitis ([place]) 11/08/2015    PCP: Rosette Reveal  REFERRING PROVIDER: Rosette Reveal  REFERRING DIAG: G57.02 (ICD-10-CM) - Piriformis syndrome, left M76.72 (ICD-10-CM) - Peroneal tendinitis, left M72.2 (ICD-10-CM) - Plantar fasciitis, left    THERAPY DIAG:  Pain in left ankle and joints of left foot - Plan: PT plan of care cert/re-cert  Muscle weakness (generalized) - Plan: PT plan of care cert/re-cert  Difficulty in walking, not elsewhere classified - Plan: PT plan of care cert/re-cert  Rationale for Evaluation and Treatment Rehabilitation  ONSET DATE: chronic   SUBJECTIVE:   SUBJECTIVE STATEMENT: Patient notes that when she stopped the medication she was on for management of L foot pain, the pain came back immediately. Patient  now has increased difficulty with walking, WB, riding bike. Patient reports that pain is in the anterior ankle joint and the arch of the L foot. Patient also has increased edema in LLE from hip to foot. Patient notes that with increased standing activities she will have increased swelling in LLE but this will decrease with rest. Patient denies any changes in skin coloration with swelling. Patient also denies any sensation changes associated with swelling.   Standing tolerance: max 30 min., swelling and pain (8/10) Walking tolerance: max 15-20 min Biking tolerance: pain doesn't start until activity is discontinued  Patient also has had L hip pain return to some degree. Notes that with increased activity she will feel the pinpoint L posterior hip pain and have swelling. This could also be aggravated by lying on back.   PERTINENT HISTORY: Peroneal tendinitis, left       Chronic issue with ongoing symptomatology.  At last visit she was noted to have focal tenderness and symptomatology that localized to the peroneal tendons on the left.  Unfortunately, due to life obligations/time commitments, she was unable to proceed with physical therapy but is amenable to do so given her persistent symptomatology and findings today.  From a medication management standpoint, she did note worsening symptoms after discontinuation of gabapentin raising concern for superimposed neuropathic involvement, plan to restart gabapentin, initiate adjunct duloxetine for musculoskeletal pain, we will coordinate a follow-up in 2 months.   PAIN:  Are you having  pain? Yes: NPRS scale: 4/10 Pain location: L anterior ankle joint (most prominent), L arch Pain description: weak, dull Aggravating factors: walking, standing, activity Relieving factors: rest,   PRECAUTIONS: None  WEIGHT BEARING RESTRICTIONS No  FALLS:  Has patient fallen in last 6 months? No  OCCUPATION: works out in AM (rowing, biking), desk job  PLOF:  Independent  PATIENT GOALS: "Be able to function without having to sit down after 20-30 minutes and not have that gnawing pain there all the time."   OBJECTIVE:   DIAGNOSTIC FINDINGS: (10/17/2020) Unremarkable left foot MRI.  No acute abnormality.; Unremarkable left ankle MRI. No evidence of tendon or ligamentous injury. No acute osseous abnormality.  PATIENT SURVEYS:  FOTO 4 (target 35)  COGNITION:  Overall cognitive status: Within functional limits for tasks assessed     SENSATION: WFL  EDEMA:  not formally assessed; on observation L ankle is mildly greater in girth than R  MUSCLE LENGTH: deferred 2/2 to time constraints Gastroc length:   POSTURE: rounded shoulders, decreased lumbar lordosis, increased thoracic kyphosis, and posterior pelvic tilt  PALPATION: TTP along L ankle joint line, L calcaneus.   LOWER EXTREMITY ROM:  Active ROM Right eval Left eval  Hip flexion    Hip extension    Hip abduction    Hip adduction    Hip internal rotation    Hip external rotation    Knee flexion    Knee extension    Ankle dorsiflexion 43 22  Ankle plantarflexion 18 7  Ankle inversion 32 28  Ankle eversion 29 14   (Blank rows = not tested)  LOWER EXTREMITY MMT:  MMT Right eval Left eval  Hip flexion    Hip extension    Hip abduction    Hip adduction    Hip internal rotation    Hip external rotation    Knee flexion 5 5  Knee extension 5 5  Ankle dorsiflexion 5 5  Ankle plantarflexion 5 5  Ankle inversion 5 5  Ankle eversion 5 5   (Blank rows = not tested)  LOWER EXTREMITY SPECIAL TESTS:  Ankle special tests: Great toe extension test: negative and Dorsiflexion-Eversion test: positive   FUNCTIONAL TESTS:  STS WFL  GAIT: not formally assessed     TODAY'S TREATMENT: HEP education: [id number]   PATIENT EDUCATION:  Patient educated throughout session on prognosis and appropriate HEP technique and form using multi-modal cueing, and activity modification.  Patient articulated understanding and returned demonstration.    HOME EXERCISE PROGRAM: [id number]  ASSESSMENT:  CLINICAL IMPRESSION: Patient is a 41 year old presenting to clinic with chief complaints of L ankle and foot pain. Upon examination, patient demonstrates deficits in L ankle ROM, pain, and function as evidenced by maximum tolerance to standing activity < 30 minutes, worst pain 8/10, L ankle AROM > than 10 degrees deficit compared to R. Patient's responses on FOTO outcome measures (47) indicate significant functional limitations/disability/distress. Patient's progress may be limited due to persistence of complaint; however, patient's previous success with PT is advantageous. Patient was able to achieve basic understanding of PROM/ankle mobilization HEP during today's evaluation and responded positively to educational interventions. Patient will benefit from continued skilled therapeutic intervention to address deficits in L ankle ROM, pain, and function in order to increase function and improve overall QOL.    OBJECTIVE IMPAIRMENTS decreased activity tolerance, decreased coordination, decreased endurance, difficulty walking, decreased strength, improper body mechanics, postural dysfunction, and pain.   ACTIVITY LIMITATIONS standing, squatting, stairs, and locomotion level  PARTICIPATION LIMITATIONS: meal prep, cleaning, laundry, shopping, and community activity  PERSONAL FACTORS Behavior pattern, Past/current experiences, Time since onset of injury/illness/exacerbation, and 3+ comorbidities: UC, TMJ, shoulder impingement syndrome  are also affecting patient's functional outcome.   REHAB POTENTIAL: Good  CLINICAL DECISION MAKING: Evolving/moderate complexity  EVALUATION COMPLEXITY: Moderate   GOALS: Goals reviewed with patient? Yes  LONG TERM GOALS: Target date: 08/06/2021   Patient will demonstrate improved function as evidenced by a score of 68 on FOTO measure for full  participation in activities at home and in the community.  Baseline: 47 Goal status: INITIAL  2.  Patient will decrease worst pain as reported on NPRS by at least 2 points to demonstrate clinically significant reduction in pain in order to restore/improve function and overall QOL. Baseline: 8/10 Goal status: INITIAL  3.  Patient will improved L ankle ROM to within 4 degrees of R ankle in all directions for improved function and participation in ADLs. Baseline: L ankle dorsiflexion 22, PF 7, eversion 14, inversion 28 Goal status: INITIAL    PLAN: PT FREQUENCY: 1x/week  PT DURATION: 8 weeks  PLANNED INTERVENTIONS: Therapeutic exercises, Therapeutic activity, Neuromuscular re-education, Balance training, Gait training, Patient/Family education, Joint mobilization, Orthotic/Fit training, Electrical stimulation, Spinal mobilization, Cryotherapy, Moist heat, Taping, and Manual therapy  PLAN FOR NEXT SESSION: complete physical assessment, manual as needed, progress HEP   Myles Gip PT, DPT [phone]  06/11/2021, 4:46 PM

## 2021-06-18 ENCOUNTER — Other Ambulatory Visit: Payer: Self-pay | Admitting: Family Medicine

## 2021-06-18 DIAGNOSIS — M7672 Peroneal tendinitis, left leg: Secondary | ICD-10-CM

## 2021-06-18 DIAGNOSIS — M5410 Radiculopathy, site unspecified: Secondary | ICD-10-CM

## 2021-06-18 DIAGNOSIS — G5702 Lesion of sciatic nerve, left lower limb: Secondary | ICD-10-CM

## 2021-06-19 NOTE — Telephone Encounter (Signed)
Rx 05/17/21 #106- 2 month supply -too soon Requested Prescriptions  Pending Prescriptions Disp Refills  . DULoxetine (CYMBALTA) 30 MG capsule [Pharmacy Med Name: DULOXETINE HCL DR 30 MG CAP] 106 capsule 0    Sig: TAKE 1 CAPSULE BY MOUTH EVERY EVENING FOR 14 DAYS, THEN 2 CAPSULES EVERY EVENING.     Psychiatry: Antidepressants - SNRI - duloxetine Passed - 06/18/2021 11:33 AM      Passed - Cr in normal range and within 360 days    Creatinine, Ser  Date Value Ref Range Status  05/17/2021 0.75 0.57 - 1.00 mg/dL Final         Passed - eGFR is 30 or above and within 360 days    eGFR  Date Value Ref Range Status  05/17/2021 103 >59 mL/min/1.73 Final         Passed - Completed PHQ-2 or PHQ-9 in the last 360 days      Passed - Last BP in normal range    BP Readings from Last 1 Encounters:  05/17/21 126/86         Passed - Valid encounter within last 6 months    Recent Outpatient Visits          1 month ago Piriformis syndrome, left   [facility] Montel Culver, MD   2 months ago Piriformis syndrome, left   [facility] Montel Culver, MD   4 months ago Piriformis syndrome, left   [facility] Montel Culver, MD   4 months ago Piriformis syndrome, left   [facility] Montel Culver, MD   5 months ago Piriformis syndrome, left   [facility] Montel Culver, MD      Future Appointments            In 4 weeks Zigmund Daniel, Earley Abide, MD [facility] [place]

## 2021-06-20 ENCOUNTER — Encounter: Payer: Self-pay | Admitting: Physical Therapy

## 2021-06-20 ENCOUNTER — Ambulatory Visit: Payer: No Typology Code available for payment source | Admitting: Physical Therapy

## 2021-06-20 DIAGNOSIS — M25572 Pain in left ankle and joints of left foot: Secondary | ICD-10-CM | POA: Diagnosis not present

## 2021-06-20 DIAGNOSIS — R262 Difficulty in walking, not elsewhere classified: Secondary | ICD-10-CM

## 2021-06-20 DIAGNOSIS — M6281 Muscle weakness (generalized): Secondary | ICD-10-CM

## 2021-06-20 NOTE — Therapy (Signed)
OUTPATIENT PHYSICAL THERAPY LOWER EXTREMITY TREATMENT   Patient Name: Michaela Cox MRN: [id number] DOB:[birth year], 41 y.o., female Today's Date: 06/20/2021   PT End of Session - 06/20/21 0858     Visit Number 2    Number of Visits 8    Date for PT Re-Evaluation 08/06/21    Authorization Type IE 06/11/2021    PT Start Time 0900    PT Stop Time 0940    PT Time Calculation (min) 40 min    Activity Tolerance Patient tolerated treatment well    Behavior During Therapy [facility] for tasks assessed/performed             Past Medical History:  Diagnosis Date   Eustachian tube disorder    Ulcerative colitis (HCC)    Past Surgical History:  Procedure Laterality Date   COLONOSCOPY  12/2016   Patient Active Problem List   Diagnosis Date Noted   Annual physical exam 05/17/2021   TMJ tenderness, bilateral 03/22/2021   Peroneal tendinitis, left 03/22/2021   Plantar fasciitis, left 03/22/2021   Radicular pain of left lower extremity 12/26/2020   Impingement syndrome of right shoulder region 03/01/2019   Piriformis syndrome, left 10/26/2018   Muscle weakness 10/26/2018   Neuropathic pain 09/29/2018   Pain in left foot 09/11/2018   Ulcerative colitis (HCC) 11/08/2015    PCP: Joseph Berkshire  REFERRING PROVIDER: Joseph Berkshire  REFERRING DIAG: G57.02 (ICD-10-CM) - Piriformis syndrome, left M76.72 (ICD-10-CM) - Peroneal tendinitis, left M72.2 (ICD-10-CM) - Plantar fasciitis, left    THERAPY DIAG:  Pain in left ankle and joints of left foot  Muscle weakness (generalized)  Difficulty in walking, not elsewhere classified  Rationale for Evaluation and Treatment Rehabilitation  ONSET DATE: chronic   SUBJECTIVE: Patient states that the bottom of the foot pain has improved some since initial evaluation. She notes that it is no longer bothered by footwear. However, the anterior ankle pain is still present and unchanged.      Standing tolerance: max 30 min., swelling and pain  (8/10) Walking tolerance: max 15-20 min Biking tolerance: pain doesn't start until activity is discontinued    PERTINENT HISTORY: Peroneal tendinitis, left       Chronic issue with ongoing symptomatology.  At last visit she was noted to have focal tenderness and symptomatology that localized to the peroneal tendons on the left.  Unfortunately, due to life obligations/time commitments, she was unable to proceed with physical therapy but is amenable to do so given her persistent symptomatology and findings today.  From a medication management standpoint, she did note worsening symptoms after discontinuation of gabapentin raising concern for superimposed neuropathic involvement, plan to restart gabapentin, initiate adjunct duloxetine for musculoskeletal pain, we will coordinate a follow-up in 2 months.   PAIN:  Are you having pain? Yes: NPRS scale: 4/10 Pain location: L anterior ankle joint (most prominent), L arch Pain description: weak, dull Aggravating factors: walking, standing, activity Relieving factors: rest,   PATIENT GOALS: "Be able to function without having to sit down after 20-30 minutes and not have that gnawing pain there all the time."   OBJECTIVE:   PATIENT SURVEYS:  FOTO 47 (target 68)  TODAY'S TREATMENT: HEP education: [id number]   PATIENT EDUCATION:  Patient educated throughout session on prognosis and appropriate HEP technique and form using multi-modal cueing, and activity modification. Patient articulated understanding and returned demonstration.    HOME EXERCISE PROGRAM: [id number]  TREATMENT  Pre-treatment assessment:  Manual Therapy: STM and TPR performed to L gastroc/soleus  to allow for decreased tension and pain and improved posture and function L ankle mobilizations with and without movement for improved mobility, grade II/III, all directions   Neuromuscular Re-education:   Therapeutic Exercise: Ankle isometrics, L, open position, 5 sec hold  x10, L (eversion/inversion/dorsiflexion/plantarflexion)  Treatments unbilled:  Post-treatment assessment:  Patient educated throughout session on appropriate technique and form using multi-modal cueing, HEP, and activity modification. Patient articulated understanding and returned demonstration.  Patient Response to interventions: 3/10 pain   ASSESSMENT:  CLINICAL IMPRESSION: Patient presents to clinic with excellent motivation to participate in therapy. Patient demonstrates deficits in L ankle ROM, pain, and function. Patient able to achieve modestly reduced pain with manual interventions during today's session and responded positively to active interventions. Patient will benefit from continued skilled therapeutic intervention to address remaining deficits in L ankle ROM, pain, and function in order to increase function and improve overall QOL.    OBJECTIVE IMPAIRMENTS decreased activity tolerance, decreased coordination, decreased endurance, difficulty walking, decreased strength, improper body mechanics, postural dysfunction, and pain.   ACTIVITY LIMITATIONS standing, squatting, stairs, and locomotion level  PARTICIPATION LIMITATIONS: meal prep, cleaning, laundry, shopping, and community activity  PERSONAL FACTORS Behavior pattern, Past/current experiences, Time since onset of injury/illness/exacerbation, and 3+ comorbidities: UC, TMJ, shoulder impingement syndrome  are also affecting patient's functional outcome.   REHAB POTENTIAL: Good  CLINICAL DECISION MAKING: Evolving/moderate complexity  EVALUATION COMPLEXITY: Moderate   GOALS: Goals reviewed with patient? Yes  LONG TERM GOALS: Target date: 08/15/2021   Patient will demonstrate improved function as evidenced by a score of 68 on FOTO measure for full participation in activities at home and in the community.  Baseline: 47 Goal status: INITIAL  2.  Patient will decrease worst pain as reported on NPRS by at least 2  points to demonstrate clinically significant reduction in pain in order to restore/improve function and overall QOL. Baseline: 8/10 Goal status: INITIAL  3.  Patient will improved L ankle ROM to within 4 degrees of R ankle in all directions for improved function and participation in ADLs. Baseline: L ankle dorsiflexion 22, PF 7, eversion 14, inversion 28 Goal status: INITIAL    PLAN: PT FREQUENCY: 1x/week  PT DURATION: 8 weeks  PLANNED INTERVENTIONS: Therapeutic exercises, Therapeutic activity, Neuromuscular re-education, Balance training, Gait training, Patient/Family education, Joint mobilization, Orthotic/Fit training, Electrical stimulation, Spinal mobilization, Cryotherapy, Moist heat, Taping, and Manual therapy  PLAN FOR NEXT SESSION: ankle motor control   Sheria Lang PT, DPT [phone]  06/20/2021, 8:59 AM

## 2021-06-20 NOTE — Patient Instructions (Signed)
Access Code: [email or web address] URL: [email or web address] Date: 06/20/2021 Prepared by: Myles Gip  Exercises - Seated Ankle Inversion Eversion PROM  - 2 sets - 10 reps - Seated Ankle Plantarflexion Dorsiflexion PROM  - 2 sets - 10 reps - Seated Anterior Tibialis Stretch  - 3 sets - 15 sec hold - Mini Squat with Counter Support  - 10 reps - Isometric Ankle Eversion at Wall  - 2 sets - 10 reps - 5 sec hold - Isometric Ankle Inversion at Wall  - 2 sets - 10 reps - 5 sec hold - Isometric Ankle Dorsiflexion and Plantarflexion  - 2 sets - 10 reps - 5 sec hold - Long Sitting Isometric Ankle Plantarflexion with Ball at [employer]  - 2 sets - 10 reps - 5 sec hold

## 2021-06-27 ENCOUNTER — Encounter: Payer: Self-pay | Admitting: Physical Therapy

## 2021-06-27 ENCOUNTER — Ambulatory Visit: Payer: No Typology Code available for payment source | Admitting: Physical Therapy

## 2021-06-27 ENCOUNTER — Encounter: Payer: Self-pay | Admitting: Family Medicine

## 2021-06-27 DIAGNOSIS — M25572 Pain in left ankle and joints of left foot: Secondary | ICD-10-CM | POA: Diagnosis not present

## 2021-06-27 DIAGNOSIS — M6281 Muscle weakness (generalized): Secondary | ICD-10-CM

## 2021-06-27 DIAGNOSIS — R262 Difficulty in walking, not elsewhere classified: Secondary | ICD-10-CM

## 2021-06-27 NOTE — Therapy (Signed)
OUTPATIENT PHYSICAL THERAPY LOWER EXTREMITY TREATMENT   Patient Name: Michaela Cox MRN: [id number] DOB:February 10, 1980, 41 y.o., female Today's Date: 06/27/2021   PT End of Session - 06/27/21 1517     Visit Number 3    Number of Visits 8    Date for PT Re-Evaluation 08/06/21    Authorization Type IE 06/11/2021    PT Start Time 1515    PT Stop Time 1555    PT Time Calculation (min) 40 min    Activity Tolerance Patient tolerated treatment well    Behavior During Therapy [facility] for tasks assessed/performed             Past Medical History:  Diagnosis Date   Eustachian tube disorder    Ulcerative colitis (HCC)    Past Surgical History:  Procedure Laterality Date   COLONOSCOPY  12/2016   Patient Active Problem List   Diagnosis Date Noted   Annual physical exam 05/17/2021   TMJ tenderness, bilateral 03/22/2021   Peroneal tendinitis, left 03/22/2021   Plantar fasciitis, left 03/22/2021   Radicular pain of left lower extremity 12/26/2020   Impingement syndrome of right shoulder region 03/01/2019   Piriformis syndrome, left 10/26/2018   Muscle weakness 10/26/2018   Neuropathic pain 09/29/2018   Pain in left foot 09/11/2018   Ulcerative colitis (HCC) 11/08/2015    PCP: Joseph Berkshire  REFERRING PROVIDER: Joseph Berkshire  REFERRING DIAG: G57.02 (ICD-10-CM) - Piriformis syndrome, left M76.72 (ICD-10-CM) - Peroneal tendinitis, left M72.2 (ICD-10-CM) - Plantar fasciitis, left    THERAPY DIAG:  Pain in left ankle and joints of left foot  Muscle weakness (generalized)  Difficulty in walking, not elsewhere classified  Rationale for Evaluation and Treatment Rehabilitation  ONSET DATE: chronic   SUBJECTIVE: Patient reports no change in symptoms. Patient notes she has had no issue/complaint with HEP and no worsening of symptoms with new exercises.  Standing tolerance: max 30 min., swelling and pain (8/10) Walking tolerance: max 15-20 min Biking tolerance: pain doesn't  start until activity is discontinued    PERTINENT HISTORY: Peroneal tendinitis, left       Chronic issue with ongoing symptomatology.  At last visit she was noted to have focal tenderness and symptomatology that localized to the peroneal tendons on the left.  Unfortunately, due to life obligations/time commitments, she was unable to proceed with physical therapy but is amenable to do so given her persistent symptomatology and findings today.  From a medication management standpoint, she did note worsening symptoms after discontinuation of gabapentin raising concern for superimposed neuropathic involvement, plan to restart gabapentin, initiate adjunct duloxetine for musculoskeletal pain, we will coordinate a follow-up in 2 months.   PAIN:  Are you having pain? Yes: NPRS scale: 4/10 Pain location: L anterior ankle joint (most prominent), L arch Pain description: weak, dull Aggravating factors: walking, standing, activity Relieving factors: rest,   PATIENT GOALS: "Be able to function without having to sit down after 20-30 minutes and not have that gnawing pain there all the time."   OBJECTIVE:   PATIENT SURVEYS:  FOTO 47 (target 68)  TODAY'S TREATMENT: HEP education: [id number]   PATIENT EDUCATION:  Patient educated throughout session on prognosis and appropriate HEP technique and form using multi-modal cueing, and activity modification. Patient articulated understanding and returned demonstration.    HOME EXERCISE PROGRAM: [id number]  TREATMENT  Pre-treatment assessment:  Manual Therapy: STM and TPR performed to L anterior tibialis and peroneals to allow for decreased tension and pain and improved position and function  Neuromuscular Re-education:   Therapeutic Exercise: Supine ankle 3 way (dorsiflexion, eversion, inversion), x20 each, B, GTB B calf raises with medial ankle block for improved motor control x15 with fingertip support B calf raises in hip ER with medial ankle  block for improved motor control x15 with fingertip support   Treatments unbilled:  Post-treatment assessment:  Patient educated throughout session on appropriate technique and form using multi-modal cueing, HEP, and activity modification. Patient articulated understanding and returned demonstration.  Patient Response to interventions: 5/10 with CKC exercises   ASSESSMENT:  CLINICAL IMPRESSION: Patient presents to clinic with excellent motivation to participate in therapy. Patient demonstrates deficits in L ankle ROM, pain, and function. Patient had limited tolerance for both OKC and CKC interventions during today's session and had possibly concordant pain with deep palpation of L anterior tibialis muscle belly. Patient will benefit from continued skilled therapeutic intervention to address remaining deficits in L ankle ROM, pain, and function in order to increase function and improve overall QOL.    OBJECTIVE IMPAIRMENTS decreased activity tolerance, decreased coordination, decreased endurance, difficulty walking, decreased strength, improper body mechanics, postural dysfunction, and pain.   ACTIVITY LIMITATIONS standing, squatting, stairs, and locomotion level  PARTICIPATION LIMITATIONS: meal prep, cleaning, laundry, shopping, and community activity  PERSONAL FACTORS Behavior pattern, Past/current experiences, Time since onset of injury/illness/exacerbation, and 3+ comorbidities: UC, TMJ, shoulder impingement syndrome  are also affecting patient's functional outcome.   REHAB POTENTIAL: Good  CLINICAL DECISION MAKING: Evolving/moderate complexity  EVALUATION COMPLEXITY: Moderate   GOALS: Goals reviewed with patient? Yes  LONG TERM GOALS: Target date: 08/06/2021   Patient will demonstrate improved function as evidenced by a score of 68 on FOTO measure for full participation in activities at home and in the community.  Baseline: 47 Goal status: INITIAL  2.  Patient will  decrease worst pain as reported on NPRS by at least 2 points to demonstrate clinically significant reduction in pain in order to restore/improve function and overall QOL. Baseline: 8/10 Goal status: INITIAL  3.  Patient will improved L ankle ROM to within 4 degrees of R ankle in all directions for improved function and participation in ADLs. Baseline: L ankle dorsiflexion 22, PF 7, eversion 14, inversion 28 Goal status: INITIAL    PLAN: PT FREQUENCY: 1x/week  PT DURATION: 8 weeks  PLANNED INTERVENTIONS: Therapeutic exercises, Therapeutic activity, Neuromuscular re-education, Balance training, Gait training, Patient/Family education, Joint mobilization, Orthotic/Fit training, Electrical stimulation, Spinal mobilization, Cryotherapy, Moist heat, Taping, and Manual therapy  PLAN FOR NEXT SESSION: ankle motor control   Sheria Lang PT, DPT [phone]  06/27/2021, 3:18 PM

## 2021-06-28 ENCOUNTER — Other Ambulatory Visit: Payer: Self-pay

## 2021-06-28 DIAGNOSIS — M5410 Radiculopathy, site unspecified: Secondary | ICD-10-CM

## 2021-06-28 DIAGNOSIS — G5702 Lesion of sciatic nerve, left lower limb: Secondary | ICD-10-CM

## 2021-06-28 DIAGNOSIS — M7672 Peroneal tendinitis, left leg: Secondary | ICD-10-CM

## 2021-06-28 MED ORDER — GABAPENTIN 100 MG PO CAPS: ORAL_CAPSULE | ORAL | 0 refills | Status: DC

## 2021-07-04 ENCOUNTER — Encounter: Payer: Self-pay | Admitting: Physical Therapy

## 2021-07-04 ENCOUNTER — Ambulatory Visit: Payer: No Typology Code available for payment source | Admitting: Physical Therapy

## 2021-07-04 DIAGNOSIS — R262 Difficulty in walking, not elsewhere classified: Secondary | ICD-10-CM

## 2021-07-04 DIAGNOSIS — M6281 Muscle weakness (generalized): Secondary | ICD-10-CM

## 2021-07-04 DIAGNOSIS — M25572 Pain in left ankle and joints of left foot: Secondary | ICD-10-CM

## 2021-07-04 NOTE — Therapy (Signed)
OUTPATIENT PHYSICAL THERAPY LOWER EXTREMITY TREATMENT   Patient Name: Letitia Sabala MRN: [id number] DOB:[birth year], 41 y.o., female Today's Date: 07/04/2021   PT End of Session - 07/04/21 0902     Visit Number 4    Number of Visits 8    Date for PT Re-Evaluation 08/06/21    Authorization Type IE 06/11/2021    PT Start Time 0900    PT Stop Time 0940    PT Time Calculation (min) 40 min    Activity Tolerance Patient tolerated treatment well    Behavior During Therapy [facility] for tasks assessed/performed             Past Medical History:  Diagnosis Date   Eustachian tube disorder    Ulcerative colitis (HCC)    Past Surgical History:  Procedure Laterality Date   COLONOSCOPY  12/2016   Patient Active Problem List   Diagnosis Date Noted   Annual physical exam 05/17/2021   TMJ tenderness, bilateral 03/22/2021   Peroneal tendinitis, left 03/22/2021   Plantar fasciitis, left 03/22/2021   Radicular pain of left lower extremity 12/26/2020   Impingement syndrome of right shoulder region 03/01/2019   Piriformis syndrome, left 10/26/2018   Muscle weakness 10/26/2018   Neuropathic pain 09/29/2018   Pain in left foot 09/11/2018   Ulcerative colitis (HCC) 11/08/2015    PCP: Joseph Berkshire  REFERRING PROVIDER: Joseph Berkshire  REFERRING DIAG: G57.02 (ICD-10-CM) - Piriformis syndrome, left M76.72 (ICD-10-CM) - Peroneal tendinitis, left M72.2 (ICD-10-CM) - Plantar fasciitis, left    THERAPY DIAG:  Pain in left ankle and joints of left foot  Muscle weakness (generalized)  Difficulty in walking, not elsewhere classified  Rationale for Evaluation and Treatment Rehabilitation  ONSET DATE: chronic   SUBJECTIVE: Patient states no change in symptoms overall but does note that with low impact sidestepping exercises, she does have some aggravation with increased repetition. Patient has been doing OKC strengthening with no increase in pain; notes shaking during exercise.    Standing tolerance: max 30 min., swelling and pain (8/10) Walking tolerance: max 15-20 min Biking tolerance: pain doesn't start until activity is discontinued    PERTINENT HISTORY: Peroneal tendinitis, left       Chronic issue with ongoing symptomatology.  At last visit she was noted to have focal tenderness and symptomatology that localized to the peroneal tendons on the left.  Unfortunately, due to life obligations/time commitments, she was unable to proceed with physical therapy but is amenable to do so given her persistent symptomatology and findings today.  From a medication management standpoint, she did note worsening symptoms after discontinuation of gabapentin raising concern for superimposed neuropathic involvement, plan to restart gabapentin, initiate adjunct duloxetine for musculoskeletal pain, we will coordinate a follow-up in 2 months.   PAIN:  Are you having pain? Yes: NPRS scale: 5/10 Pain location: L anterior ankle joint (most prominent), L arch Pain description: weak, dull Aggravating factors: walking, standing, activity Relieving factors: rest,   PATIENT GOALS: "Be able to function without having to sit down after 20-30 minutes and not have that gnawing pain there all the time."   OBJECTIVE:   PATIENT SURVEYS:  FOTO 47 (target 68)  TODAY'S TREATMENT: HEP education: [id number]   PATIENT EDUCATION:  Patient educated throughout session on prognosis and appropriate HEP technique and form using multi-modal cueing, and activity modification. Patient articulated understanding and returned demonstration.    HOME EXERCISE PROGRAM: [id number]  TREATMENT  Pre-treatment assessment:  Manual Therapy: STM and TPR performed to L  anterior tibialis and peroneals to allow for decreased tension and pain and improved position and function Ankle mobilizations in all directions, grade II/III for improved mobility and decreased pain  Neuromuscular Re-education: Standing  postural control interventions with varying angles of dorsiflexion for improved tolerance to WB:  Pilates chest expansion  Pilates serve a tray  Pilates hug a tree  Therapeutic Exercise: Longsitting L ankle dorsiflexion, GTB, x30 reps Anterior tibialis OKC endurance hold with hip flexion march, x12 reps each leg  Treatments unbilled:  Post-treatment assessment:  Patient educated throughout session on appropriate technique and form using multi-modal cueing, HEP, and activity modification. Patient articulated understanding and returned demonstration.  Patient Response to interventions: Denies increased pain at end of session   ASSESSMENT:  CLINICAL IMPRESSION: Patient presents to clinic with excellent motivation to participate in therapy. Patient demonstrates deficits in L ankle ROM, pain, and function. Patient able to perform standing proximal control interventions without aggravation of L ankle despite increased angle of DF during today's session and was able to perform anterior tib strengthening OKC without increased pain. Patient will benefit from continued skilled therapeutic intervention to address remaining deficits in L ankle ROM, pain, and function in order to increase function and improve overall QOL.    OBJECTIVE IMPAIRMENTS decreased activity tolerance, decreased coordination, decreased endurance, difficulty walking, decreased strength, improper body mechanics, postural dysfunction, and pain.   ACTIVITY LIMITATIONS standing, squatting, stairs, and locomotion level  PARTICIPATION LIMITATIONS: meal prep, cleaning, laundry, shopping, and community activity  PERSONAL FACTORS Behavior pattern, Past/current experiences, Time since onset of injury/illness/exacerbation, and 3+ comorbidities: UC, TMJ, shoulder impingement syndrome  are also affecting patient's functional outcome.   REHAB POTENTIAL: Good  CLINICAL DECISION MAKING: Evolving/moderate complexity  EVALUATION  COMPLEXITY: Moderate   GOALS: Goals reviewed with patient? Yes  LONG TERM GOALS: Target date: 08/06/2021   Patient will demonstrate improved function as evidenced by a score of 68 on FOTO measure for full participation in activities at home and in the community.  Baseline: 47 Goal status: INITIAL  2.  Patient will decrease worst pain as reported on NPRS by at least 2 points to demonstrate clinically significant reduction in pain in order to restore/improve function and overall QOL. Baseline: 8/10 Goal status: INITIAL  3.  Patient will improved L ankle ROM to within 4 degrees of R ankle in all directions for improved function and participation in ADLs. Baseline: L ankle dorsiflexion 22, PF 7, eversion 14, inversion 28 Goal status: INITIAL    PLAN: PT FREQUENCY: 1x/week  PT DURATION: 8 weeks  PLANNED INTERVENTIONS: Therapeutic exercises, Therapeutic activity, Neuromuscular re-education, Balance training, Gait training, Patient/Family education, Joint mobilization, Orthotic/Fit training, Electrical stimulation, Spinal mobilization, Cryotherapy, Moist heat, Taping, and Manual therapy  PLAN FOR NEXT SESSION: ankle motor control   Sheria Lang PT, DPT [phone]  07/04/2021, 9:03 AM

## 2021-07-11 ENCOUNTER — Encounter: Payer: No Typology Code available for payment source | Admitting: Physical Therapy

## 2021-07-12 ENCOUNTER — Ambulatory Visit: Payer: No Typology Code available for payment source | Attending: Family Medicine | Admitting: Physical Therapy

## 2021-07-12 ENCOUNTER — Encounter: Payer: Self-pay | Admitting: Physical Therapy

## 2021-07-12 DIAGNOSIS — M6281 Muscle weakness (generalized): Secondary | ICD-10-CM | POA: Insufficient documentation

## 2021-07-12 DIAGNOSIS — M25572 Pain in left ankle and joints of left foot: Secondary | ICD-10-CM | POA: Diagnosis not present

## 2021-07-12 DIAGNOSIS — R262 Difficulty in walking, not elsewhere classified: Secondary | ICD-10-CM | POA: Insufficient documentation

## 2021-07-12 NOTE — Therapy (Signed)
OUTPATIENT PHYSICAL THERAPY LOWER EXTREMITY TREATMENT   Patient Name: Michaela Cox MRN: [id number] DOB:03/04/1980, 41 y.o., female Today's Date: 07/12/2021   PT End of Session - 07/12/21 0859     Visit Number 5    Number of Visits 8    Date for PT Re-Evaluation 08/06/21    Authorization Type IE 06/11/2021    PT Start Time 0900    PT Stop Time 0940    PT Time Calculation (min) 40 min    Activity Tolerance Patient tolerated treatment well    Behavior During Therapy WFL for tasks assessed/performed             Past Medical History:  Diagnosis Date   Eustachian tube disorder    Ulcerative colitis (HCC)    Past Surgical History:  Procedure Laterality Date   COLONOSCOPY  12/2016   Patient Active Problem List   Diagnosis Date Noted   Annual physical exam 05/17/2021   TMJ tenderness, bilateral 03/22/2021   Peroneal tendinitis, left 03/22/2021   Plantar fasciitis, left 03/22/2021   Radicular pain of left lower extremity 12/26/2020   Impingement syndrome of right shoulder region 03/01/2019   Piriformis syndrome, left 10/26/2018   Muscle weakness 10/26/2018   Neuropathic pain 09/29/2018   Pain in left foot 09/11/2018   Ulcerative colitis (HCC) 11/08/2015    PCP: Matthews, Jason  REFERRING PROVIDER: Matthews, Jason  REFERRING DIAG: G57.02 (ICD-10-CM) - Piriformis syndrome, left M76.72 (ICD-10-CM) - Peroneal tendinitis, left M72.2 (ICD-10-CM) - Plantar fasciitis, left    THERAPY DIAG:  Pain in left ankle and joints of left foot  Muscle weakness (generalized)  Difficulty in walking, not elsewhere classified  Rationale for Evaluation and Treatment Rehabilitation  ONSET DATE: chronic   SUBJECTIVE: Patient notes L ankle pain is somewhat improved today, but has increased L foot pain. Patient notes that she continues to have increased pain intensity with WB activities.   Standing tolerance: max 30 min., swelling and pain (8/10) Walking tolerance: max 15-20  min Biking tolerance: pain doesn't start until activity is discontinued    PERTINENT HISTORY: Peroneal tendinitis, left       Chronic issue with ongoing symptomatology.  At last visit she was noted to have focal tenderness and symptomatology that localized to the peroneal tendons on the left.  Unfortunately, due to life obligations/time commitments, she was unable to proceed with physical therapy but is amenable to do so given her persistent symptomatology and findings today.  From a medication management standpoint, she did note worsening symptoms after discontinuation of gabapentin raising concern for superimposed neuropathic involvement, plan to restart gabapentin, initiate adjunct duloxetine for musculoskeletal pain, we will coordinate a follow-up in 2 months.   PAIN:  Are you having pain? Yes: NPRS scale: 4/10 Pain location: L anterior ankle joint (most prominent), L arch Pain description: weak, dull Aggravating factors: walking, standing, activity Relieving factors: rest,   PATIENT GOALS: "Be able to function without having to sit down after 20-30 minutes and not have that gnawing pain there all the time."   OBJECTIVE:   PATIENT SURVEYS:  FOTO 47 (target 68)  HOME EXERCISE PROGRAM: [id number]  TREATMENT  Pre-treatment assessment: 4/10  Manual Therapy:  Neuromuscular Re-education: Reassessed goals; see below.  Graded activity (outdoor walking with modest incline), 10 minutes, 5/10 NPRS post-activity with TTP over EDL and extensor retinaculum.  Discussion about use of orthotics to offer foot greater stability.  Therapeutic Exercise:   Treatments unbilled:  Post-treatment assessment:  Patient educated throughout session   on appropriate technique and form using multi-modal cueing, HEP, and activity modification. Patient articulated understanding and returned demonstration.  Patient Response to interventions: Comfortable to f/u after MD  visit   ASSESSMENT:  CLINICAL IMPRESSION: Patient presents to clinic with excellent motivation to participate in therapy. Patient demonstrates deficits in L ankle ROM, pain, and function. Patient indicating improved AROM of L ankle to roughly symmetrical with R as well as clinically significant reduction in worst pain intensity during today's session and tolerated graded return to outdoor walking modestly; however, there has been no meaningful change in function, and patient continues to have the same chief complaint: anterolateral ankle pain. We had a frank conversation about continuing physical therapy in light of no significant change in function as measured by FOTO measure and by patient report, and patient is amenable to follow-up after MD visit on 07/17/21. Patient has had limited response to manual interventions and while she has had no worsening of symptoms with control and strengthening interventions performed in progression from isometric to concentric in [facility] and [facility], it is unclear if they are offering benefit at this time. Patient will benefit from continued skilled therapeutic intervention to address remaining deficits in L ankle ROM, pain, and function in order to increase function and improve overall QOL.    OBJECTIVE IMPAIRMENTS decreased activity tolerance, decreased coordination, decreased endurance, difficulty walking, decreased strength, improper body mechanics, postural dysfunction, and pain.   ACTIVITY LIMITATIONS standing, squatting, stairs, and locomotion level  PARTICIPATION LIMITATIONS: meal prep, cleaning, laundry, shopping, and community activity  PERSONAL FACTORS Behavior pattern, Past/current experiences, Time since onset of injury/illness/exacerbation, and 3+ comorbidities: UC, TMJ, shoulder impingement syndrome  are also affecting patient's functional outcome.   REHAB POTENTIAL: Good  CLINICAL DECISION MAKING: Evolving/moderate complexity  EVALUATION COMPLEXITY:  Moderate   GOALS: Goals reviewed with patient? Yes  LONG TERM GOALS: Target date: 08/06/2021   Patient will demonstrate improved function as evidenced by a score of 68 on FOTO measure for full participation in activities at home and in the community.  Baseline: 47; 7/6: 47 Goal status: NOT MET  2.  Patient will decrease worst pain as reported on NPRS by at least 2 points to demonstrate clinically significant reduction in pain in order to restore/improve function and overall QOL. Baseline: 8/10; 7/6: 5/10 Goal status: IN PROGRESS  3.  Patient will improved L ankle ROM to within 4 degrees of R ankle in all directions for improved function and participation in ADLs. Baseline: L ankle dorsiflexion 22, PF 7, eversion 14, inversion 28; 7/6:AROM L ankle dorsiflexion 41, PF 11, eversion 32, inversion 35 (R ankle DF 42, PF 12, eversion 30, inversion 32) Goal status: MET    PLAN: PT FREQUENCY: 1x/week  PT DURATION: 8 weeks  PLANNED INTERVENTIONS: Therapeutic exercises, Therapeutic activity, Neuromuscular re-education, Balance training, Gait training, Patient/Family education, Joint mobilization, Orthotic/Fit training, Electrical stimulation, Spinal mobilization, Cryotherapy, Moist heat, Taping, and Manual therapy  PLAN FOR NEXT SESSION: ankle motor control   Myles Gip PT, DPT [phone]  07/12/2021, 8:59 AM

## 2021-07-17 ENCOUNTER — Ambulatory Visit
Admission: RE | Admit: 2021-07-17 | Discharge: 2021-07-17 | Disposition: A | Discharge status: Home / Self Care | Payer: No Typology Code available for payment source | Attending: Family Medicine | Admitting: Family Medicine

## 2021-07-17 ENCOUNTER — Ambulatory Visit
Admission: RE | Admit: 2021-07-17 | Discharge: 2021-07-17 | Disposition: A | Discharge status: Home / Self Care | Payer: No Typology Code available for payment source | Source: Ambulatory Visit | Attending: Family Medicine | Admitting: Family Medicine

## 2021-07-17 ENCOUNTER — Encounter: Payer: Self-pay | Admitting: Family Medicine

## 2021-07-17 ENCOUNTER — Ambulatory Visit (INDEPENDENT_AMBULATORY_CARE_PROVIDER_SITE_OTHER): Payer: No Typology Code available for payment source | Admitting: Family Medicine

## 2021-07-17 DIAGNOSIS — M5410 Radiculopathy, site unspecified: Secondary | ICD-10-CM

## 2021-07-17 DIAGNOSIS — G5702 Lesion of sciatic nerve, left lower limb: Secondary | ICD-10-CM

## 2021-07-17 DIAGNOSIS — M7672 Peroneal tendinitis, left leg: Secondary | ICD-10-CM

## 2021-07-17 MED ORDER — GABAPENTIN 400 MG PO CAPS: 400.0000 mg | ORAL_CAPSULE | Freq: Three times a day (TID) | ORAL | 2 refills | Status: DC | PRN

## 2021-07-17 MED ORDER — DULOXETINE HCL 60 MG PO CPEP: 60.0000 mg | ORAL_CAPSULE | Freq: Every evening | ORAL | 0 refills | Status: DC

## 2021-07-17 NOTE — Progress Notes (Signed)
Primary Care / Sports Medicine Office Visit  Patient Information:  Patient ID: Michaela Cox, female DOB: [birth year] Age: 41 y.o. MRN: [id number]   Michaela Cox is a pleasant 41 y.o. female presenting with the following:  Chief Complaint  Patient presents with   Piriformis syndrome, left    Pt states she is feeling better, ankle hurts every now and again. Increase Gabapentin?     Vitals:   07/17/21 0802  BP: 120/80  Pulse: 85  SpO2: 99%   Vitals:   07/17/21 0802  Weight: 144 lb 6.4 oz (65.5 kg)  Height: 5\' 3"  (1.6 m)   Body mass index is 25.58 kg/m.  No results found.   Independent interpretation of notes and tests performed by another provider:   None  Procedures performed:   None  Pertinent History, Exam, Impression, and Recommendations:   Problem List Items Addressed This Visit       Nervous and Auditory   Piriformis syndrome, left   Relevant Medications   DULoxetine (CYMBALTA) 60 MG capsule   gabapentin (NEURONTIN) 400 MG capsule   Other Relevant Orders   DG Lumbar Spine Complete     Musculoskeletal and Integument   Peroneal tendinitis, left    Today's examination does not show focality to the paramedics put more so to the anterior tibialis/anterior ankle, given her overall clinical presentation/ankle symptoms can be considered secondary/compensatory to her lumbosacral spine and hips.  I have advised her to trial topical Voltaren gel for this area due to her history of ulcerative colitis and avoidance of oral NSAIDs.  We will coordinate follow-up for titration of gabapentin.      Relevant Medications   DULoxetine (CYMBALTA) 60 MG capsule   gabapentin (NEURONTIN) 400 MG capsule     Other   Radicular pain of left lower extremity    Since initiation and titration of gabapentin to 400 mg, patient has noted associated improvement - though not resolution - of her symptoms. Distribution of symptoms are to the left lower leg, dorsum of foot and ankle,  worse towards the end of the day. Examination shows negative SLR, +FABER, equivocal FADIR, tenderness at the left greater trochanter.  Her findings do raise persistent concern for lumbosacral based etiology regarding her left lower extremity radiculopathy, plan for dedicated lumbar spine films, further titration of gabapentin 400 mg nightly to up to TID dosing. If she cannot tolerated AM or midday dosing, we will plan to increase her PM dose.      Relevant Medications   DULoxetine (CYMBALTA) 60 MG capsule   gabapentin (NEURONTIN) 400 MG capsule   Other Relevant Orders   DG Lumbar Spine Complete     Orders & Medications Meds ordered this encounter  Medications   DULoxetine (CYMBALTA) 60 MG capsule    Sig: Take 1 capsule (60 mg total) by mouth every evening.    Dispense:  90 capsule    Refill:  0   gabapentin (NEURONTIN) 400 MG capsule    Sig: Take 1 capsule (400 mg total) by mouth 3 (three) times daily as needed. One cap PO qHS for a week, then 2 caps PO qHS for a week, continue to increase weekly until symptoms controlle,d do not exceed 6 caps qHS    Dispense:  90 capsule    Refill:  2   Orders Placed This Encounter  Procedures   DG Lumbar Spine Complete     No follow-ups on file.     ,  MD   [place]

## 2021-07-17 NOTE — Patient Instructions (Signed)
-   Obtain low back x-rays - Continue duloxetine (Cymbalta) 60 mg daily (new 60 mg capsule Rx sent) - Continue nighttime gabapentin 400 mg - Start a.m. and midday gabapentin 400 mg doses on an as-needed basis if beneficial and tolerable - If unable to tolerate a.m. or midday gabapentin dosing, contact our office for next steps - Trial topical anti-inflammatory Voltaren gel (diclofenac 1%) up to 4 times a day on the foot/ankle for pain control - Continue home exercises for the low back/hips and foot/ankle

## 2021-07-17 NOTE — Assessment & Plan Note (Signed)
Since initiation and titration of gabapentin to 400 mg, patient has noted associated improvement - though not resolution - of her symptoms. Distribution of symptoms are to the left lower leg, dorsum of foot and ankle, worse towards the end of the day. Examination shows negative SLR, +FABER, equivocal FADIR, tenderness at the left greater trochanter.  Her findings do raise persistent concern for lumbosacral based etiology regarding her left lower extremity radiculopathy, plan for dedicated lumbar spine films, further titration of gabapentin 400 mg nightly to up to TID dosing. If she cannot tolerated AM or midday dosing, we will plan to increase her PM dose.

## 2021-07-17 NOTE — Assessment & Plan Note (Signed)
Today's examination does not show focality to the paramedics put more so to the anterior tibialis/anterior ankle, given her overall clinical presentation/ankle symptoms can be considered secondary/compensatory to her lumbosacral spine and hips.  I have advised her to trial topical Voltaren gel for this area due to her history of ulcerative colitis and avoidance of oral NSAIDs.  We will coordinate follow-up for titration of gabapentin.

## 2021-07-18 ENCOUNTER — Other Ambulatory Visit: Payer: Self-pay | Admitting: Family Medicine

## 2021-07-18 DIAGNOSIS — M5410 Radiculopathy, site unspecified: Secondary | ICD-10-CM

## 2021-07-18 DIAGNOSIS — M7672 Peroneal tendinitis, left leg: Secondary | ICD-10-CM

## 2021-07-18 DIAGNOSIS — G5702 Lesion of sciatic nerve, left lower limb: Secondary | ICD-10-CM

## 2021-07-18 NOTE — Telephone Encounter (Signed)
Requested medication (s) are due for refill today: no  Requested medication (s) are on the active medication list: yes  Last refill:  07/17/21  Future visit scheduled: yes  Notes to clinic:  Unable to refill per protocol, last refill by provider on 07/17/21 for 90 days and 0 refills. Possible duplicate request.     Requested Prescriptions  Pending Prescriptions Disp Refills   DULoxetine (CYMBALTA) 30 MG capsule [Pharmacy Med Name: DULOXETINE HCL DR 30 MG CAP] 106 capsule 0    Sig: TAKE 1 CAPSULE BY MOUTH EVERY EVENING FOR 14 DAYS, THEN 2 CAPSULES EVERY EVENING.     Psychiatry: Antidepressants - SNRI - duloxetine Passed - 07/18/2021 12:44 PM      Passed - Cr in normal range and within 360 days    Creatinine, Ser  Date Value Ref Range Status  05/17/2021 0.75 0.57 - 1.00 mg/dL Final         Passed - eGFR is 30 or above and within 360 days    eGFR  Date Value Ref Range Status  05/17/2021 103 >59 mL/min/1.73 Final         Passed - Completed PHQ-2 or PHQ-9 in the last 360 days      Passed - Last BP in normal range    BP Readings from Last 1 Encounters:  07/17/21 120/80         Passed - Valid encounter within last 6 months    Recent Outpatient Visits           Yesterday Piriformis syndrome, left   [facility] Montel Culver, MD   2 months ago Piriformis syndrome, left   [facility] Montel Culver, MD   3 months ago Piriformis syndrome, left   [facility] Montel Culver, MD   5 months ago Piriformis syndrome, left   [facility] Montel Culver, MD   5 months ago Piriformis syndrome, left   [facility] Montel Culver, MD       Future Appointments             In 1 month Zigmund Daniel, Earley Abide, MD [facility], [facility]

## 2021-07-18 NOTE — Progress Notes (Signed)
Appointment made for 08/28/2021

## 2021-07-19 ENCOUNTER — Encounter: Payer: No Typology Code available for payment source | Admitting: Physical Therapy

## 2021-08-22 ENCOUNTER — Encounter: Payer: Self-pay | Admitting: Gastroenterology

## 2021-08-22 ENCOUNTER — Ambulatory Visit (INDEPENDENT_AMBULATORY_CARE_PROVIDER_SITE_OTHER): Payer: No Typology Code available for payment source | Admitting: Gastroenterology

## 2021-08-22 VITALS — BP 128/85 | HR 77 | Temp 98.4°F | Ht 63.5 in | Wt 147.0 lb

## 2021-08-22 DIAGNOSIS — K51919 Ulcerative colitis, unspecified with unspecified complications: Secondary | ICD-10-CM

## 2021-08-22 MED ORDER — NA SULFATE-K SULFATE-MG SULF 17.5-3.13-1.6 GM/177ML PO SOLN: 1.0000 | Freq: Once | ORAL | 0 refills | Status: AC

## 2021-08-22 MED ORDER — MESALAMINE 4 G RE ENEM
4.0000 g | ENEMA | Freq: Every day | RECTAL | 1 refills | Status: DC
Start: 2021-08-22 — End: 2021-10-16

## 2021-08-22 NOTE — Progress Notes (Signed)
Gastroenterology Consultation  Referring Provider:     Montel Culver, MD Primary Care Physician:  Montel Culver, MD Primary Gastroenterologist:  Dr. Allen Norris     Reason for Consultation:     History of ulcerative colitis        HPI:   Samhita Kretsch is a 41 y.o. y/o female referred for consultation & management of history of ulcerative colitis by Dr. Zigmund Daniel, Earley Abide, MD. This patient comes in today after being seen in the past by Dr. Epimenio Foot in [facility] for colitis.  The patient reports that she has ankle pain and joint pain but also reports that this is all the time and not only with flares of her colitis.  The patient believes she only has proctitis and has been treated with Rowasa enemas in the past.  She stopped working in [place] and now works from home but lives around here so wanted to change and get established with a Database administrator.  She denies any black stools but reports that she has been having rectal bleeding with diarrhea for the last week.  She states that this is consistent with her flare.  She believes she was first diagnosed with ulcerative colitis somewhere around 2018.  She also believes that she may be due for repeat colonoscopy.  She also endorses that she feels that her symptoms are worse when she gets her period.  Past Medical History:  Diagnosis Date   Eustachian tube disorder    Ulcerative colitis [facility])     Past Surgical History:  Procedure Laterality Date   COLONOSCOPY  12/2016    Prior to Admission medications   Medication Sig Start Date End Date Taking? Authorizing Provider  DULoxetine (CYMBALTA) 60 MG capsule Take 1 capsule (60 mg total) by mouth every evening. 07/17/21   Montel Culver, MD  gabapentin (NEURONTIN) 400 MG capsule Take 1 capsule (400 mg total) by mouth 3 (three) times daily as needed. One cap PO qHS for a week, then 2 caps PO qHS for a week, continue to increase weekly until symptoms controlle,d do not exceed 6 caps qHS 07/17/21    Montel Culver, MD  Multiple Vitamin (ONE DAILY) tablet Take 1 tablet by mouth daily.    [provider]  Turmeric (QC TUMERIC COMPLEX PO) Take by mouth.    [provider]    Family History  Problem Relation Age of Onset   Cancer Mother        gallbladder   Liver disease Mother    GER disease Sister    Asthma Daughter    Lung cancer Maternal Aunt    Hypertension Maternal Grandmother    Lung cancer Maternal Grandfather    Hypertension Paternal Grandfather    Diabetes Paternal Grandfather      Social History   Tobacco Use   Smoking status: Former    Years: 15.00    Types: Cigarettes    Quit date: 2014    Years since quitting: 9.6   Smokeless tobacco: Never  Vaping Use   Vaping Use: Never used  Substance Use Topics   Alcohol use: Yes   Drug use: Not Currently    Types: Marijuana    Allergies as of 08/22/2021   (No Known Allergies)    Review of Systems:    All systems reviewed and negative except where noted in HPI.   Physical Exam:  There were no vitals taken for this visit. No LMP recorded. General:   Alert,  Well-developed, well-nourished, pleasant and cooperative in NAD Head:  Normocephalic and atraumatic. Eyes:  Sclera clear, no icterus.   Conjunctiva pink. Ears:  Normal auditory acuity. Neck:  Supple; no masses or thyromegaly. Lungs:  Respirations even and unlabored.  Clear throughout to auscultation.   No wheezes, crackles, or rhonchi. No acute distress. Heart:  Regular rate and rhythm; no murmurs, clicks, rubs, or gallops. Abdomen:  Normal bowel sounds.  No bruits.  Soft, non-tender and non-distended without masses, hepatosplenomegaly or hernias noted.  No guarding or rebound tenderness.  Negative Carnett sign.   Rectal:  Deferred.  Pulses:  Normal pulses noted. Extremities:  No clubbing or edema.  No cyanosis. Neurologic:  Alert and oriented x3;  grossly normal neurologically. Skin:  Intact without significant lesions or rashes.   )No jaundice. Lymph Nodes:  No significant cervical adenopathy. Psych:  Alert and cooperative. Normal mood and affect.  Imaging Studies: No results found.  Assessment and Plan:   Doreena Maulden is a 41 y.o. y/o female who comes in today with a history of ulcerative proctitis who states that she responded in the past to Rowasa enemas and she usually took them only before she went to sleep.  The patient will have her stool sent off for a GI panel to rule out any infection as a cause of her exacerbation.  The patient will also have a prescription for Rowasa enemas to be started and she will also be set up for a colonoscopy after the medication has been given at time to work and heal up her active flare.  The patient has been told to contact me if her symptoms do not improve and she may need to be started on budesonide.  The patient has been told that if she does not respond to these interventions then I may have her see my partner Dr. Marius Ditch for further recommendations.  The patient has been explained the plan agrees with it.    Lucilla Lame, MD. Marval Regal    Note: This dictation was prepared with Dragon dictation along with smaller phrase technology. Any transcriptional errors that result from this process are unintentional.

## 2021-08-26 LAB — GI PROFILE, STOOL, PCR

## 2021-08-28 ENCOUNTER — Encounter: Payer: Self-pay | Admitting: Family Medicine

## 2021-08-28 ENCOUNTER — Ambulatory Visit (INDEPENDENT_AMBULATORY_CARE_PROVIDER_SITE_OTHER): Payer: No Typology Code available for payment source | Admitting: Family Medicine

## 2021-08-28 VITALS — BP 130/80 | HR 78 | Ht 63.0 in | Wt 150.0 lb

## 2021-08-28 DIAGNOSIS — Z23 Encounter for immunization: Secondary | ICD-10-CM | POA: Insufficient documentation

## 2021-08-28 DIAGNOSIS — K51919 Ulcerative colitis, unspecified with unspecified complications: Secondary | ICD-10-CM

## 2021-08-28 DIAGNOSIS — M4727 Other spondylosis with radiculopathy, lumbosacral region: Secondary | ICD-10-CM | POA: Diagnosis not present

## 2021-08-28 DIAGNOSIS — M7672 Peroneal tendinitis, left leg: Secondary | ICD-10-CM

## 2021-08-28 DIAGNOSIS — M5410 Radiculopathy, site unspecified: Secondary | ICD-10-CM | POA: Diagnosis not present

## 2021-08-28 MED ORDER — GABAPENTIN 100 MG PO CAPS: 100.0000 mg | ORAL_CAPSULE | Freq: Every morning | ORAL | 1 refills | Status: DC

## 2021-08-28 MED ORDER — DIFICID 200 MG PO TABS: 200.0000 mg | ORAL_TABLET | Freq: Two times a day (BID) | ORAL | 0 refills | Status: DC

## 2021-08-28 NOTE — Assessment & Plan Note (Signed)
Has established with gastroenterologist and has colonoscopy scheduled.

## 2021-08-28 NOTE — Assessment & Plan Note (Signed)
Patient has noted near-resolution of lumbosacral / hip pain with radiation through the leg. Has noted this after titration of gabapentin with current dosing of 400 mg nightly and 400 mg nearly every AM. The only issue is the occasional AM drowsiness with gabapentin. The duloxetine has been doing well for her as well.  Plan to continue gabapentin 400 mg QHS and modify to 100-300 mg QAM, new Rx sent. Will follow-up on this at return and home exercises to continue over interim.

## 2021-08-28 NOTE — Assessment & Plan Note (Signed)
Annual influenza vaccine administered today.

## 2021-08-28 NOTE — Patient Instructions (Signed)
-   Continue nighttime gabapentin 400 mg - Change AM gabapentin to 100-300 mg (lowest dose needed to control symptoms) - Maintain follow-up with gastroenterologist (GI) doctor - Follow-up with foot & ankle doctor with residual symptoms - Return in 4 months, contact for any questions between now and then

## 2021-08-28 NOTE — Addendum Note (Signed)
Addended by: Lurlean Nanny on: 08/28/2021 02:36 PM   Modules accepted: Orders

## 2021-08-28 NOTE — Assessment & Plan Note (Addendum)
Chronic pain persists, MRI of foot and ankle were reassuring and ipsilateral radiculopathy has been well-controlled. Her residual anterior ankle pain most likely represents relative overuse compounded by biomechanics. Advised patient to check back in with foot & ankle orthopedic specialist given recent control over lumbosacral radicular symptoms.

## 2021-08-28 NOTE — Progress Notes (Addendum)
     Primary Care / Sports Medicine Office Visit  Patient Information:  Patient ID: Michaela Cox, female DOB: [birth year] Age: 41 y.o. MRN: [id number]   Michaela Cox is a pleasant 41 y.o. female presenting with the following:  Chief Complaint  Patient presents with   Piriformis syndrome, left    Leg feeling better, ankle is still bothering her at times    Vitals:   08/28/21 0900  BP: 130/80  Pulse: 78  SpO2: 98%   Vitals:   08/28/21 0900  Weight: 150 lb (68 kg)  Height: 5' 3"  (1.6 m)   Body mass index is 26.57 kg/m.  No results found.   Independent interpretation of notes and tests performed by another provider:   Independent interpretation of lumbar spine x-rays reveals subtle intervertebral narrowing at L5-S1 with facet hypertrophy, stool burden noted throughout ascending and descending colon.  Procedures performed:   None  Pertinent History, Exam, Impression, and Recommendations:   Problem List Items Addressed This Visit       Digestive   Ulcerative colitis ([place])    Has established with gastroenterologist and has colonoscopy scheduled.        Nervous and Auditory   Lumbosacral spondylosis with radiculopathy - Primary    Patient has noted near-resolution of lumbosacral / hip pain with radiation through the leg. Has noted this after titration of gabapentin with current dosing of 400 mg nightly and 400 mg nearly every AM. The only issue is the occasional AM drowsiness with gabapentin. The duloxetine has been doing well for her as well.  Plan to continue gabapentin 400 mg QHS and modify to 100-300 mg QAM, new Rx sent. Will follow-up on this at return and home exercises to continue over interim.      Relevant Medications   gabapentin (NEURONTIN) 100 MG capsule     Musculoskeletal and Integument   Peroneal tendinitis, left    Chronic pain persists, MRI of foot and ankle were reassuring and ipsilateral radiculopathy has been well-controlled. Her residual  anterior ankle pain most likely represents relative overuse compounded by biomechanics. Advised patient to check back in with foot & ankle orthopedic specialist given recent control over lumbosacral radicular symptoms.        Other   Need for immunization against influenza    Annual influenza vaccine administered today.      Relevant Orders   Flu Vaccine QUAD 67moIM (Fluarix, Fluzone & Alfiuria Quad PF) (Completed)     Orders & Medications Meds ordered this encounter  Medications   gabapentin (NEURONTIN) 100 MG capsule    Sig: Take 1 capsule (100 mg total) by mouth in the morning.    Dispense:  90 capsule    Refill:  1   Orders Placed This Encounter  Procedures   Flu Vaccine QUAD 676moM (Fluarix, Fluzone & Alfiuria Quad PF)     Return in about 4 months (around 12/28/2021).     JaMontel CulverMD   Primary Care Sports Medicine [place]

## 2021-09-14 NOTE — Addendum Note (Signed)
Addended by: Lurlean Nanny on: 09/14/2021 11:06 AM   Modules accepted: Orders

## 2021-10-01 ENCOUNTER — Encounter: Payer: Self-pay | Admitting: Gastroenterology

## 2021-10-03 ENCOUNTER — Encounter: Payer: Self-pay | Admitting: Family Medicine

## 2021-10-03 ENCOUNTER — Other Ambulatory Visit: Payer: Self-pay

## 2021-10-03 DIAGNOSIS — M7672 Peroneal tendinitis, left leg: Secondary | ICD-10-CM

## 2021-10-03 DIAGNOSIS — M79672 Pain in left foot: Secondary | ICD-10-CM

## 2021-10-03 DIAGNOSIS — M722 Plantar fascial fibromatosis: Secondary | ICD-10-CM

## 2021-10-03 NOTE — Telephone Encounter (Signed)
Referral placed.  KP 

## 2021-10-03 NOTE — Telephone Encounter (Signed)
Please review.  KP

## 2021-10-04 ENCOUNTER — Encounter: Payer: Self-pay | Admitting: Gastroenterology

## 2021-10-08 ENCOUNTER — Ambulatory Visit: Payer: No Typology Code available for payment source | Admitting: Anesthesiology

## 2021-10-08 ENCOUNTER — Encounter
Admission: RE | Disposition: A | Discharge status: Home / Self Care | Payer: Self-pay | Source: Home / Self Care | Attending: Gastroenterology

## 2021-10-08 ENCOUNTER — Other Ambulatory Visit: Payer: Self-pay

## 2021-10-08 ENCOUNTER — Ambulatory Visit
Admission: RE | Admit: 2021-10-08 | Discharge: 2021-10-08 | Disposition: A | Discharge status: Home / Self Care | Payer: No Typology Code available for payment source | Attending: Gastroenterology | Admitting: Gastroenterology

## 2021-10-08 ENCOUNTER — Encounter: Payer: Self-pay | Admitting: Gastroenterology

## 2021-10-08 DIAGNOSIS — K5190 Ulcerative colitis, unspecified, without complications: Secondary | ICD-10-CM

## 2021-10-08 DIAGNOSIS — K51919 Ulcerative colitis, unspecified with unspecified complications: Secondary | ICD-10-CM

## 2021-10-08 DIAGNOSIS — K219 Gastro-esophageal reflux disease without esophagitis: Secondary | ICD-10-CM | POA: Diagnosis not present

## 2021-10-08 DIAGNOSIS — Z8719 Personal history of other diseases of the digestive system: Secondary | ICD-10-CM | POA: Insufficient documentation

## 2021-10-08 DIAGNOSIS — K6289 Other specified diseases of anus and rectum: Secondary | ICD-10-CM | POA: Diagnosis not present

## 2021-10-08 DIAGNOSIS — Z87891 Personal history of nicotine dependence: Secondary | ICD-10-CM | POA: Insufficient documentation

## 2021-10-08 DIAGNOSIS — K279 Peptic ulcer, site unspecified, unspecified as acute or chronic, without hemorrhage or perforation: Secondary | ICD-10-CM | POA: Insufficient documentation

## 2021-10-08 HISTORY — DX: Motion sickness, initial encounter: T75.3XXA

## 2021-10-08 HISTORY — DX: Gastro-esophageal reflux disease without esophagitis: K21.9

## 2021-10-08 HISTORY — DX: Other specified postprocedural states: R11.2

## 2021-10-08 HISTORY — DX: Other specified postprocedural states: Z98.890

## 2021-10-08 HISTORY — PX: COLONOSCOPY: SHX5424

## 2021-10-08 LAB — POCT PREGNANCY, URINE: Preg Test, Ur: NEGATIVE

## 2021-10-08 SURGERY — COLONOSCOPY
Anesthesia: General | Site: Rectum

## 2021-10-08 MED ORDER — LIDOCAINE HCL (CARDIAC) PF 100 MG/5ML IV SOSY
PREFILLED_SYRINGE | INTRAVENOUS | Status: DC | PRN
  Administered 2021-10-08: 60 mg via INTRAVENOUS

## 2021-10-08 MED ORDER — STERILE WATER FOR IRRIGATION IR SOLN
Status: DC | PRN
  Administered 2021-10-08: 200 mL

## 2021-10-08 MED ORDER — LACTATED RINGERS IV SOLN: INTRAVENOUS | Status: DC

## 2021-10-08 MED ORDER — PROPOFOL 10 MG/ML IV BOLUS
INTRAVENOUS | Status: DC | PRN
  Administered 2021-10-08: 20 mg via INTRAVENOUS
  Administered 2021-10-08: 40 mg via INTRAVENOUS
  Administered 2021-10-08 (×2): 70 mg via INTRAVENOUS
  Administered 2021-10-08: 50 mg via INTRAVENOUS
  Administered 2021-10-08 (×3): 20 mg via INTRAVENOUS

## 2021-10-08 MED ORDER — SODIUM CHLORIDE 0.9 % IV SOLN: INTRAVENOUS | Status: DC

## 2021-10-08 MED ORDER — ONDANSETRON HCL 4 MG/2ML IJ SOLN
INTRAMUSCULAR | Status: DC | PRN
  Administered 2021-10-08: 4 mg via INTRAVENOUS

## 2021-10-08 SURGICAL SUPPLY — 7 items
FORCEPS BIOP RAD 4 LRG CAP 4 (CUTTING FORCEPS) IMPLANT
GOWN CVR UNV OPN BCK APRN NK (MISCELLANEOUS) ×2 IMPLANT
GOWN ISOL THUMB LOOP REG UNIV (MISCELLANEOUS) ×2
KIT PRC NS LF DISP ENDO (KITS) ×1 IMPLANT
KIT PROCEDURE OLYMPUS (KITS) ×1
MANIFOLD NEPTUNE II (INSTRUMENTS) ×1 IMPLANT
WATER STERILE IRR 250ML POUR (IV SOLUTION) ×1 IMPLANT

## 2021-10-08 NOTE — Transfer of Care (Signed)
Immediate Anesthesia Transfer of Care Note  Patient: Michaela Cox  Procedure(s) Performed: COLONOSCOPY (Rectum)  Patient Location: PACU  Anesthesia Type: General  Level of Consciousness: awake, alert  and patient cooperative  Airway and Oxygen Therapy: Patient Spontanous Breathing and Patient connected to supplemental oxygen  Post-op Assessment: Post-op Vital signs reviewed, Patient's Cardiovascular Status Stable, Respiratory Function Stable, Patent Airway and No signs of Nausea or vomiting  Post-op Vital Signs: Reviewed and stable  Complications: No notable events documented.

## 2021-10-08 NOTE — H&P (Signed)
Michaela Lame, MD [address] [place] [place], [place] [address] Phone:[phone] Fax : [phone]  Primary Care Physician:  Michaela Culver, MD Primary Gastroenterologist:  Dr. Allen Norris  Pre-Procedure History & Physical: HPI:  Michaela Cox is a 41 y.o. female is here for an colonoscopy.   Past Medical History:  Diagnosis Date   Eustachian tube disorder    GERD (gastroesophageal reflux disease)    Motion sickness    PONV (postoperative nausea and vomiting)    Ulcerative colitis [facility])     Past Surgical History:  Procedure Laterality Date   COLONOSCOPY  12/2016    Prior to Admission medications   Medication Sig Start Date End Date Taking? Authorizing Provider  DULoxetine (CYMBALTA) 60 MG capsule Take 1 capsule (60 mg total) by mouth every evening. 07/17/21  Yes Michaela Culver, MD  gabapentin (NEURONTIN) 100 MG capsule Take 1 capsule (100 mg total) by mouth in the morning. 08/28/21  Yes Michaela Culver, MD  gabapentin (NEURONTIN) 400 MG capsule Take 1 capsule (400 mg total) by mouth 3 (three) times daily as needed. One cap PO qHS for a week, then 2 caps PO qHS for a week, continue to increase weekly until symptoms controlle,d do not exceed 6 caps qHS Patient taking differently: Take 400 mg by mouth 3 (three) times daily as needed. One cap PO qHS for a week, then 2 caps PO qHS for a week, continue to increase weekly until symptoms controlle,d do not exceed 6 caps qHS (10/01/21 - takes  400 mg QHS) 07/17/21  Yes Michaela Culver, MD  Multiple Vitamin (ONE DAILY) tablet Take 1 tablet by mouth daily.   Yes [provider]  Turmeric (QC TUMERIC COMPLEX PO) Take by mouth.   Yes [provider]  VITAMIN D PO Take by mouth daily.   Yes [provider]  fidaxomicin (DIFICID) 200 MG TABS tablet Take 1 tablet (200 mg total) by mouth 2 (two) times daily. Patient not taking: Reported on 10/01/2021 08/28/21   Michaela Lame, MD  mesalamine (ROWASA) 4 g enema  Place 60 mLs (4 g total) rectally at bedtime. Patient not taking: Reported on 10/01/2021 08/22/21   Michaela Lame, MD  Na Sulfate-K Sulfate-Mg Sulf 17.5-3.13-1.6 GM/177ML SOLN Take by mouth as directed. 08/22/21   [provider]    Allergies as of 09/14/2021   (No Known Allergies)    Family History  Problem Relation Age of Onset   Cancer Mother        gallbladder   Liver disease Mother    GER disease Sister    Asthma Daughter    Lung cancer Maternal Aunt    Hypertension Maternal Grandmother    Lung cancer Maternal Grandfather    Hypertension Paternal Grandfather    Diabetes Paternal Grandfather     Social History   Socioeconomic History   Marital status: Significant Other    Spouse name: Michaela Cox   Number of children: 1   Years of education: Not on file   Highest education level: Associate degree: academic program  Occupational History   Not on file  Tobacco Use   Smoking status: Former    Years: 15.00    Types: Cigarettes    Quit date: 2014    Years since quitting: 9.7   Smokeless tobacco: Never  Vaping Use   Vaping Use: Never used  Substance and Sexual Activity   Alcohol use: Yes    Comment: Rare   Drug use:  Not Currently    Types: Marijuana   Sexual activity: Yes    Birth control/protection: None  Other Topics Concern   Not on file  Social History Narrative   Not on file   Social Determinants of Health   Financial Resource Strain: Low Risk  (08/28/2021)   Overall Financial Resource Strain (CARDIA)    Difficulty of Paying Living Expenses: Not hard at all  Food Insecurity: No Food Insecurity (08/28/2021)   Hunger Vital Sign    Worried About Running Out of Food in the Last Year: Never true    Ran Out of Food in the Last Year: Never true  Transportation Needs: No Transportation Needs (08/28/2021)   PRAPARE - Transportation    Lack of Transportation (Medical): No    Lack of Transportation (Non-Medical): No  Physical Activity: Not on file   Stress: No Stress Concern Present (08/28/2021)   [place]    Feeling of Stress : Only a little  Social Connections: Socially Integrated (08/28/2021)   Social Connection and Isolation Panel [NHANES]    Frequency of Communication with Friends and Family: Not on file    Frequency of Social Gatherings with Friends and Family: More than three times a week    Attends Religious Services: More than 4 times per year    Active Member of [employer] or Organizations: Yes    Attends Archivist Meetings: More than 4 times per year    Marital Status: Living with partner  Intimate Partner Violence: Not At Risk (08/28/2021)   Humiliation, Afraid, Rape, and Kick questionnaire    Fear of Current or Ex-Partner: No    Emotionally Abused: No    Physically Abused: No    Sexually Abused: No    Review of Systems: See HPI, otherwise negative ROS  Physical Exam: BP 123/73   Pulse 66   Temp 98.2 F (36.8 C) (Temporal)   Resp 17   Ht 5' 3"  (1.6 m)   Wt 66.2 kg   LMP 09/11/2021 (Exact Date)   SpO2 100%   BMI 25.86 kg/m  General:   Alert,  pleasant and cooperative in NAD Head:  Normocephalic and atraumatic. Neck:  Supple; no masses or thyromegaly. Lungs:  Clear throughout to auscultation.    Heart:  Regular rate and rhythm. Abdomen:  Soft, nontender and nondistended. Normal bowel sounds, without guarding, and without rebound.   Neurologic:  Alert and  oriented x4;  grossly normal neurologically.  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  Questions have been answered.  All parties agreeable.   Michaela Lame, MD  10/08/2021, 9:30 AM

## 2021-10-08 NOTE — Op Note (Signed)
[facility] Patient Name: Ayisha Pol Procedure Date: 10/08/2021 10:14 AM MRN: [id number] Account #: [id number] Date of Birth: November 25, 1980 Admit Type: Outpatient Age: 41 Room: [facility] OR ROOM 01 Gender: Female Note Status: Finalized Instrument Name: [id number] Procedure:             Colonoscopy Indications:           Personal history of ulcerative colitis Providers:             Lucilla Lame MD, MD Medicines:             Propofol per Anesthesia Complications:         No immediate complications. Procedure:             Pre-Anesthesia Assessment:                        - Prior to the procedure, a History and Physical was                         performed, and patient medications and allergies were                         reviewed. The patient's tolerance of previous                         anesthesia was also reviewed. The risks and benefits                         of the procedure and the sedation options and risks                         were discussed with the patient. All questions were                         answered, and informed consent was obtained. Prior                         Anticoagulants: The patient has taken no previous                         anticoagulant or antiplatelet agents. ASA Grade                         Assessment: II - A patient with mild systemic disease.                         After reviewing the risks and benefits, the patient                         was deemed in satisfactory condition to undergo the                         procedure.                        After obtaining informed consent, the colonoscope was                         passed under direct vision. Throughout the procedure,  the patient's blood pressure, pulse, and oxygen                         saturations were monitored continuously. The                         Colonoscope was introduced through the anus and                          advanced to the the cecum, identified by appendiceal                         orifice and ileocecal valve. The colonoscopy was                         performed without difficulty. The patient tolerated                         the procedure well. The quality of the bowel                         preparation was excellent. Findings:      The perianal and digital rectal examinations were normal.      Localized mild inflammation characterized by erythema was found in the       rectum. Four biopsies were taken every 10 cm with a cold forceps from       the entire colon. These biopsy specimens were sent to Pathology. Impression:            - Localized mild inflammation was found in the rectum                         secondary to proctitis. Biopsied. Recommendation:        - Discharge patient to home.                        - Resume previous diet.                        - Continue present medications.                        - Await pathology results.                        - Repeat colonoscopy in 3 years for surveillance. Procedure Code(s):     --- Professional ---                        [phone], Colonoscopy, flexible; with biopsy, single or                         multiple Diagnosis Code(s):     --- Professional ---                        Z87.19, Personal history of other diseases of the                         digestive system  K62.89, Other specified diseases of anus and rectum CPT copyright 2019 American Medical Association. All rights reserved. The codes documented in this report are preliminary and upon coder review may  be revised to meet current compliance requirements. Lucilla Lame MD, MD 10/08/2021 10:44:20 AM This report has been signed electronically. Number of Addenda: 0 Note Initiated On: 10/08/2021 10:14 AM Scope Withdrawal Time: 0 hours 10 minutes 30 seconds  Total Procedure Duration: 0 hours 17 minutes 8 seconds  Estimated Blood Loss:  Estimated blood loss:  none.      [facility]

## 2021-10-08 NOTE — Anesthesia Preprocedure Evaluation (Signed)
Anesthesia Evaluation  Patient identified by MRN, date of birth, ID band Patient awake    Reviewed: Allergy & Precautions, NPO status , Patient's Chart, lab work & pertinent test results  History of Anesthesia Complications (+) PONV and history of anesthetic complications  Airway Mallampati: III  TM Distance: <3 FB Neck ROM: full    Dental  (+) Chipped   Pulmonary neg shortness of breath, former smoker,    Pulmonary exam normal        Cardiovascular negative cardio ROS Normal cardiovascular exam     Neuro/Psych  Neuromuscular disease negative psych ROS   GI/Hepatic Neg liver ROS, PUD, GERD  Controlled,  Endo/Other  negative endocrine ROS  Renal/GU negative Renal ROS  negative genitourinary   Musculoskeletal   Abdominal   Peds  Hematology negative hematology ROS (+)   Anesthesia Other Findings Past Medical History: No date: Eustachian tube disorder No date: GERD (gastroesophageal reflux disease) No date: Motion sickness No date: PONV (postoperative nausea and vomiting) No date: Ulcerative colitis ([place])  Past Surgical History: 12/2016: COLONOSCOPY  BMI    Body Mass Index: 25.86 kg/m      Reproductive/Obstetrics negative OB ROS                             Anesthesia Physical Anesthesia Plan  ASA: 2  Anesthesia Plan: General   Post-op Pain Management:    Induction: Intravenous  PONV Risk Score and Plan: Propofol infusion and TIVA  Airway Management Planned: Natural Airway and Nasal Cannula  Additional Equipment:   Intra-op Plan:   Post-operative Plan:   Informed Consent: I have reviewed the patients History and Physical, chart, labs and discussed the procedure including the risks, benefits and alternatives for the proposed anesthesia with the patient or authorized representative who has indicated his/her understanding and acceptance.     Dental Advisory  Given  Plan Discussed with: Anesthesiologist, CRNA and Surgeon  Anesthesia Plan Comments: (Patient consented for risks of anesthesia including but not limited to:  - adverse reactions to medications - risk of airway placement if required - damage to eyes, teeth, lips or other oral mucosa - nerve damage due to positioning  - sore throat or hoarseness - Damage to heart, brain, nerves, lungs, other parts of body or loss of life  Patient voiced understanding.)        Anesthesia Quick Evaluation

## 2021-10-08 NOTE — Anesthesia Postprocedure Evaluation (Signed)
Anesthesia Post Note  Patient: Michaela Cox  Procedure(s) Performed: COLONOSCOPY (Rectum)  Patient location during evaluation: Endoscopy Anesthesia Type: General Level of consciousness: awake and alert Pain management: pain level controlled Vital Signs Assessment: post-procedure vital signs reviewed and stable Respiratory status: spontaneous breathing, nonlabored ventilation, respiratory function stable and patient connected to nasal cannula oxygen Cardiovascular status: blood pressure returned to baseline and stable Postop Assessment: no apparent nausea or vomiting Anesthetic complications: no   No notable events documented.   Last Vitals:  Vitals:   10/08/21 0919 10/08/21 1045  BP: 123/73 (!) 108/90  Pulse: 66 71  Resp: 17 18  Temp: 36.8 C (!) 36.4 C  SpO2: 100% 100%    Last Pain:  Vitals:   10/08/21 1045  TempSrc:   PainSc: 0-No pain                 Precious Haws Leilanny Fluitt

## 2021-10-09 ENCOUNTER — Encounter: Payer: Self-pay | Admitting: Gastroenterology

## 2021-10-09 LAB — SURGICAL PATHOLOGY

## 2021-10-10 ENCOUNTER — Encounter: Payer: Self-pay | Admitting: Gastroenterology

## 2021-10-13 ENCOUNTER — Other Ambulatory Visit: Payer: Self-pay | Admitting: Gastroenterology

## 2021-10-13 ENCOUNTER — Other Ambulatory Visit: Payer: Self-pay | Admitting: Family Medicine

## 2021-10-13 DIAGNOSIS — G5702 Lesion of sciatic nerve, left lower limb: Secondary | ICD-10-CM

## 2021-10-13 DIAGNOSIS — M5410 Radiculopathy, site unspecified: Secondary | ICD-10-CM

## 2021-10-13 DIAGNOSIS — M7672 Peroneal tendinitis, left leg: Secondary | ICD-10-CM

## 2021-10-15 NOTE — Telephone Encounter (Signed)
Requested Prescriptions  Pending Prescriptions Disp Refills  . DULoxetine (CYMBALTA) 60 MG capsule [Pharmacy Med Name: DULOXETINE HCL DR 60 MG CAP] 90 capsule 0    Sig: TAKE 1 CAPSULE BY MOUTH EVERY EVENING.     Psychiatry: Antidepressants - SNRI - duloxetine Failed - 10/13/2021  9:20 AM      Failed - Last BP in normal range    BP Readings from Last 1 Encounters:  10/08/21 (!) 108/90         Passed - Cr in normal range and within 360 days    Creatinine, Ser  Date Value Ref Range Status  05/17/2021 0.75 0.57 - 1.00 mg/dL Final         Passed - eGFR is 30 or above and within 360 days    eGFR  Date Value Ref Range Status  05/17/2021 103 >59 mL/min/1.73 Final         Passed - Completed PHQ-2 or PHQ-9 in the last 360 days      Passed - Valid encounter within last 6 months    Recent Outpatient Visits          1 month ago Lumbosacral spondylosis with radiculopathy   [facility] [place], Earley Abide, MD   3 months ago Piriformis syndrome, left   [place] [facility] at [place], Earley Abide, MD   5 months ago Piriformis syndrome, left   [place] [facility] at [place], Earley Abide, MD   6 months ago Piriformis syndrome, left   [facility] at [place], Earley Abide, MD   8 months ago Piriformis syndrome, left   [facility] at [facility], Earley Abide, MD      Future Appointments            In 2 months Zigmund Daniel, Earley Abide, MD [place] and [facility] at [facility], [facility]

## 2021-10-22 ENCOUNTER — Other Ambulatory Visit: Payer: Self-pay

## 2021-10-22 DIAGNOSIS — M5410 Radiculopathy, site unspecified: Secondary | ICD-10-CM

## 2021-10-22 DIAGNOSIS — M722 Plantar fascial fibromatosis: Secondary | ICD-10-CM

## 2021-10-22 DIAGNOSIS — M79672 Pain in left foot: Secondary | ICD-10-CM

## 2021-11-07 ENCOUNTER — Encounter: Payer: Self-pay | Admitting: Family Medicine

## 2021-11-07 NOTE — Telephone Encounter (Signed)
Spoke to [place] she stated she would send over the images.  KP

## 2021-11-07 NOTE — Telephone Encounter (Signed)
Sent message to [place]. To see if MRI results can be sent.  KP

## 2021-12-28 ENCOUNTER — Ambulatory Visit: Payer: No Typology Code available for payment source | Admitting: Family Medicine

## 2022-01-06 ENCOUNTER — Other Ambulatory Visit: Payer: Self-pay | Admitting: Family Medicine

## 2022-01-06 DIAGNOSIS — M7672 Peroneal tendinitis, left leg: Secondary | ICD-10-CM

## 2022-01-06 DIAGNOSIS — G5702 Lesion of sciatic nerve, left lower limb: Secondary | ICD-10-CM

## 2022-01-06 DIAGNOSIS — M5410 Radiculopathy, site unspecified: Secondary | ICD-10-CM

## 2022-01-08 NOTE — Telephone Encounter (Signed)
Requested Prescriptions  Pending Prescriptions Disp Refills   DULoxetine (CYMBALTA) 60 MG capsule [Pharmacy Med Name: DULOXETINE HCL DR 60 MG CAP] 90 capsule 0    Sig: TAKE 1 CAPSULE BY MOUTH EVERY DAY IN THE EVENING     Psychiatry: Antidepressants - SNRI - duloxetine Failed - 01/06/2022 11:31 AM      Failed - Last BP in normal range    BP Readings from Last 1 Encounters:  10/08/21 (!) 108/90         Passed - Cr in normal range and within 360 days    Creatinine, Ser  Date Value Ref Range Status  05/17/2021 0.75 0.57 - 1.00 mg/dL Final         Passed - eGFR is 30 or above and within 360 days    eGFR  Date Value Ref Range Status  05/17/2021 103 >59 mL/min/1.73 Final         Passed - Completed PHQ-2 or PHQ-9 in the last 360 days      Passed - Valid encounter within last 6 months    Recent Outpatient Visits           4 months ago Lumbosacral spondylosis with radiculopathy   [place] at [place], Earley Abide, MD   5 months ago Piriformis syndrome, left   [place] [facility] at [place], Earley Abide, MD   7 months ago Piriformis syndrome, left   [place] [facility] at [place], Earley Abide, MD   9 months ago Piriformis syndrome, left   [facility] at [facility], Earley Abide, MD   11 months ago Piriformis syndrome, left   [facility] at [facility], Earley Abide, MD

## 2022-03-31 ENCOUNTER — Ambulatory Visit
Admission: EM | Admit: 2022-03-31 | Discharge: 2022-03-31 | Disposition: A | Discharge status: Home / Self Care | Payer: No Typology Code available for payment source | Attending: Nurse Practitioner | Admitting: Nurse Practitioner

## 2022-03-31 ENCOUNTER — Encounter: Payer: Self-pay | Admitting: Emergency Medicine

## 2022-03-31 DIAGNOSIS — R112 Nausea with vomiting, unspecified: Secondary | ICD-10-CM | POA: Insufficient documentation

## 2022-03-31 DIAGNOSIS — Z1152 Encounter for screening for COVID-19: Secondary | ICD-10-CM | POA: Insufficient documentation

## 2022-03-31 LAB — RESP PANEL BY RT-PCR (RSV, FLU A&B, COVID)  RVPGX2
Influenza A by PCR: NEGATIVE
Influenza B by PCR: NEGATIVE
Resp Syncytial Virus by PCR: NEGATIVE
SARS Coronavirus 2 by RT PCR: NEGATIVE

## 2022-03-31 MED ORDER — METOCLOPRAMIDE HCL 10 MG PO TABS: 10.0000 mg | ORAL_TABLET | Freq: Three times a day (TID) | ORAL | 0 refills | Status: DC | PRN

## 2022-03-31 MED ORDER — METOCLOPRAMIDE HCL 5 MG/ML IJ SOLN
10.0000 mg | Freq: Once | INTRAMUSCULAR | Status: AC
  Administered 2022-03-31: 10 mg via INTRAMUSCULAR

## 2022-03-31 MED ORDER — PROMETHAZINE HCL 25 MG/ML IJ SOLN: 12.5000 mg | Freq: Once | INTRAMUSCULAR | Status: DC

## 2022-03-31 NOTE — Discharge Instructions (Addendum)
Reglan as needed for nausea/vomiting Try to stay hydrated with water, Gatorade, Powerade, Pedialyte Follow-up with your PCP if your symptoms do not improve Please go to the emergency room if you have any worsening symptoms

## 2022-03-31 NOTE — ED Triage Notes (Signed)
Patient states on Wed she had a scope procedure on her left ankle.  Patient states that the swelling has increase in her left ankle.  Patient states that she started having vomiting on Friday.  Patient has tried Zofran at home but makes her feel dizzy.  Patient reports chills.  Patient unsure of fevers.

## 2022-03-31 NOTE — ED Notes (Signed)
Patient tolerated po water well. 

## 2022-03-31 NOTE — ED Provider Notes (Signed)
[facility]    CSN: [id number] Arrival date & time: 03/31/22  1131      History   Chief Complaint Chief Complaint  Patient presents with   Emesis   Ankle Pain    swelling    HPI Michaela Cox is a 42 y.o. female presents for evaluation of nausea and vomiting.  Patient had a left ankle arthroscopy and debridement with [facility] on 3/21.  She is currently in a cam boot.  She does have a known reaction of nausea and vomiting after anesthesia.  She was given sublingual Zofran which she has been taking for her nausea and vomiting but states it makes her too dizzy and does not help her vomiting.  She reports since yesterday she has been unable to keep any fluids or food down.  Does endorse chills but denies fevers, URI symptoms, abdominal pain, diarrhea.  She does have a history of ulcerative colitis but states she does not normally have vomiting with this.  They did call the nurse triage line and advised her to go to urgent care as she may have norovirus.  Denies any known sick contacts.  She has tried a couple over-the-counter nausea medications without improvement.  She also previously used scopolamine patch but again this makes her too dizzy.  She has been unable to take her pain/inflammation medicine from her arthroscopy due to her nausea and vomiting and does report increasing pain to her foot/ankle.  No other concerns at this time.   Emesis Ankle Pain   Past Medical History:  Diagnosis Date   Eustachian tube disorder    GERD (gastroesophageal reflux disease)    Motion sickness    PONV (postoperative nausea and vomiting)    Ulcerative colitis [facility])     Patient Active Problem List   Diagnosis Date Noted   Personal history of disease of digestive system    Need for immunization against influenza 08/28/2021   Annual physical exam 05/17/2021   TMJ tenderness, bilateral 03/22/2021   Peroneal tendinitis, left 03/22/2021   Plantar fasciitis, left 03/22/2021    Lumbosacral spondylosis with radiculopathy 12/26/2020   Impingement syndrome of right shoulder region 03/01/2019   Piriformis syndrome, left 10/26/2018   Muscle weakness 10/26/2018   Neuropathic pain 09/29/2018   Pain in left foot 09/11/2018   Ulcerative colitis ([place]) 11/08/2015    Past Surgical History:  Procedure Laterality Date   ANKLE SURGERY Left    COLONOSCOPY  12/2016   COLONOSCOPY N/A 10/08/2021   Procedure: COLONOSCOPY;  Surgeon: Lucilla Lame, MD;  Location: [place];  Service: Endoscopy;  Laterality: N/A;    OB History   No obstetric history on file.      Home Medications    Prior to Admission medications   Medication Sig Start Date End Date Taking? Authorizing Provider  metoCLOPramide (REGLAN) 10 MG tablet Take 1 tablet (10 mg total) by mouth every 8 (eight) hours as needed for nausea. 03/31/22  Yes Melynda Ripple, NP  ondansetron (ZOFRAN-ODT) 4 MG disintegrating tablet Place 1 tablet every 6-8 hours by translingual route. 03/25/22  Yes [provider]  DULoxetine (CYMBALTA) 60 MG capsule TAKE 1 CAPSULE BY MOUTH EVERY DAY IN THE EVENING 01/08/22   Montel Culver, MD  fidaxomicin (DIFICID) 200 MG TABS tablet Take 1 tablet (200 mg total) by mouth 2 (two) times daily. Patient not taking: Reported on 10/01/2021 08/28/21   Lucilla Lame, MD  gabapentin (NEURONTIN) 100 MG capsule Take 1 capsule (100 mg  total) by mouth in the morning. 08/28/21   Montel Culver, MD  gabapentin (NEURONTIN) 400 MG capsule Take 1 capsule (400 mg total) by mouth 3 (three) times daily as needed. One cap PO qHS for a week, then 2 caps PO qHS for a week, continue to increase weekly until symptoms controlle,d do not exceed 6 caps qHS Patient taking differently: Take 400 mg by mouth 3 (three) times daily as needed. One cap PO qHS for a week, then 2 caps PO qHS for a week, continue to increase weekly until symptoms controlle,d do not exceed 6 caps qHS (10/01/21 - takes  400 mg QHS) 07/17/21    Montel Culver, MD  mesalamine (ROWASA) 4 g enema PLACE 60 MLS (4 G TOTAL) RECTALLY AT BEDTIME. 10/16/21   Lucilla Lame, MD  Multiple Vitamin (ONE DAILY) tablet Take 1 tablet by mouth daily.    [provider]  Na Sulfate-K Sulfate-Mg Sulf 17.5-3.13-1.6 GM/177ML SOLN Take by mouth as directed. 08/22/21   [provider]  Turmeric (QC TUMERIC COMPLEX PO) Take by mouth.    [provider]  VITAMIN D PO Take by mouth daily.    [provider]    Family History Family History  Problem Relation Age of Onset   Cancer Mother        gallbladder   Liver disease Mother    GER disease Sister    Asthma Daughter    Lung cancer Maternal Aunt    Hypertension Maternal Grandmother    Lung cancer Maternal Grandfather    Hypertension Paternal Grandfather    Diabetes Paternal Grandfather     Social History Social History   Tobacco Use   Smoking status: Former    Years: 16    Types: Cigarettes    Quit date: 2014    Years since quitting: 10.2   Smokeless tobacco: Never  Vaping Use   Vaping Use: Never used  Substance Use Topics   Alcohol use: Yes    Comment: Rare   Drug use: Not Currently    Types: Marijuana     Allergies   Patient has no known allergies.   Review of Systems Review of Systems  Gastrointestinal:  Positive for nausea and vomiting.     Physical Exam Triage Vital Signs ED Triage Vitals  Enc Vitals Group     BP 03/31/22 1143 (!) 141/84     Pulse Rate 03/31/22 1143 68     Resp 03/31/22 1143 14     Temp 03/31/22 1143 98.4 F (36.9 C)     Temp Source 03/31/22 1143 Oral     SpO2 03/31/22 1143 98 %     Weight 03/31/22 1140 145 lb 15.1 oz (66.2 kg)     Height 03/31/22 1140 5\' 3"  (1.6 m)     Head Circumference --      Peak Flow --      Pain Score 03/31/22 1140 5     Pain Loc --      Pain Edu? --      Excl. in [place]? --    No data found.  Updated Vital Signs BP (!) 141/84 (BP Location: Right Arm)   Pulse 68   Temp 98.4 F  (36.9 C) (Oral)   Resp 14   Ht 5\' 3"  (1.6 m)   Wt 145 lb 15.1 oz (66.2 kg)   LMP 02/14/2022   SpO2 98%   BMI 25.85 kg/m   Visual Acuity Right Eye Distance:  Left Eye Distance:   Bilateral Distance:    Right Eye Near:   Left Eye Near:    Bilateral Near:     Physical Exam Vitals and nursing note reviewed.  Constitutional:      General: She is not in acute distress.    Appearance: She is well-developed. She is not ill-appearing.  HENT:     Head: Normocephalic and atraumatic.     Right Ear: Tympanic membrane and ear canal normal.     Left Ear: Tympanic membrane and ear canal normal.     Nose: No congestion.     Mouth/Throat:     Mouth: Mucous membranes are moist.     Pharynx: Oropharynx is clear. Uvula midline. No posterior oropharyngeal erythema.     Tonsils: No tonsillar exudate or tonsillar abscesses.  Eyes:     Conjunctiva/sclera: Conjunctivae normal.     Pupils: Pupils are equal, round, and reactive to light.  Cardiovascular:     Rate and Rhythm: Normal rate and regular rhythm.     Heart sounds: Normal heart sounds.  Pulmonary:     Effort: Pulmonary effort is normal.     Breath sounds: Normal breath sounds.  Abdominal:     General: Bowel sounds are normal.     Palpations: Abdomen is soft.     Tenderness: There is no abdominal tenderness.  Musculoskeletal:     Cervical back: Normal range of motion and neck supple.  Lymphadenopathy:     Cervical: No cervical adenopathy.  Skin:    General: Skin is warm and dry.  Neurological:     General: No focal deficit present.     Mental Status: She is alert and oriented to person, place, and time.  Psychiatric:        Mood and Affect: Mood normal.        Behavior: Behavior normal.      UC Treatments / Results  Labs (all labs ordered are listed, but only abnormal results are displayed) Labs Reviewed  RESP PANEL BY RT-PCR (RSV, FLU A&B, COVID)  [id number]    EKG   Radiology No results  found.  Procedures Procedures (including critical care time)  Medications Ordered in UC Medications  metoCLOPramide (REGLAN) injection 10 mg (10 mg Intramuscular Given 03/31/22 1200)    Initial Impression / Assessment and Plan / UC Course  I have reviewed the triage vital signs and the nursing notes.  Pertinent labs & imaging results that were available during my care of the patient were reviewed by me and considered in my medical decision making (see chart for details).     Reviewed exam and symptoms with patient She is unable to tolerate Zofran due to dizziness, IM Reglan given in clinic.  Patient monitored for 15 minutes after injection with no reaction noted and tolerated well.  Patient reports improvement in nausea and was able to keep water down Rx Reglan as needed Encouraged fluids Rest Follow-up with PCP if symptoms do not improve ER precautions reviewed and patient verbalized understanding Final Clinical Impressions(s) / UC Diagnoses   Final diagnoses:  Nausea and vomiting, unspecified vomiting type     Discharge Instructions      Reglan as needed for nausea/vomiting Try to stay hydrated with water, Gatorade, Powerade, Pedialyte Follow-up with your PCP if your symptoms do not improve Please go to the emergency room if you have any worsening symptoms     ED Prescriptions     Medication Sig Dispense Auth. Provider  metoCLOPramide (REGLAN) 10 MG tablet Take 1 tablet (10 mg total) by mouth every 8 (eight) hours as needed for nausea. 15 tablet Melynda Ripple, NP      PDMP not reviewed this encounter.   Melynda Ripple, NP 03/31/22 [phone]

## 2022-04-08 ENCOUNTER — Other Ambulatory Visit: Payer: Self-pay | Admitting: Family Medicine

## 2022-04-08 DIAGNOSIS — G5702 Lesion of sciatic nerve, left lower limb: Secondary | ICD-10-CM

## 2022-04-08 DIAGNOSIS — M7672 Peroneal tendinitis, left leg: Secondary | ICD-10-CM

## 2022-04-08 DIAGNOSIS — M5410 Radiculopathy, site unspecified: Secondary | ICD-10-CM

## 2022-04-08 NOTE — Telephone Encounter (Signed)
Patient called, left VM to return the call to the office. She will need to be scheduled for an OV with Dr. Zigmund Daniel, noted to return 12/28/21 and that was cancelled. Will send in a courtesy refill due to needing an appointment.    Requested Prescriptions  Pending Prescriptions Disp Refills   DULoxetine (CYMBALTA) 60 MG capsule [Pharmacy Med Name: DULOXETINE HCL DR 60 MG CAP] 90 capsule 0    Sig: TAKE 1 CAPSULE BY MOUTH EVERY EVENING     Psychiatry: Antidepressants - SNRI - duloxetine Failed - 04/08/2022  2:29 AM      Failed - Last BP in normal range    BP Readings from Last 1 Encounters:  03/31/22 (!) 141/84         Failed - Valid encounter within last 6 months    Recent Outpatient Visits           7 months ago Lumbosacral spondylosis with radiculopathy   [place] [facility] at [place], Earley Abide, MD   8 months ago Piriformis syndrome, left   [facility] at [place], Earley Abide, MD   10 months ago Piriformis syndrome, left   [facility] Medicine at [place], Earley Abide, MD   1 year ago Piriformis syndrome, left   [facility] Primary Care & Sports Medicine at [place], Earley Abide, MD   1 year ago Piriformis syndrome, left   [facility] Primary [place] at [facility], Earley Abide, MD              Passed - Cr in normal range and within 360 days    Creatinine, Ser  Date Value Ref Range Status  05/17/2021 0.75 0.57 - 1.00 mg/dL Final         Passed - eGFR is 30 or above and within 360 days    eGFR  Date Value Ref Range Status  05/17/2021 103 >59 mL/min/1.73 Final         Passed - Completed PHQ-2 or PHQ-9 in the last 360 days

## 2022-04-09 NOTE — Telephone Encounter (Signed)
Requested Prescriptions  Pending Prescriptions Disp Refills   gabapentin (NEURONTIN) 400 MG capsule [Pharmacy Med Name: GABAPENTIN 400 MG CAPSULE] 90 capsule 2    Sig: ONE CAP AT BEDTIME FOR A WEEK, THEN 2 CAPS AT BEDTIME FOR A WEEK, CONTINUE TO INCREASE WEEKLY UNTIL SYMPTOMS CONTROLLED MAX: 6 CAPS AT BEDTIME     Neurology: Anticonvulsants - gabapentin Passed - 04/08/2022  4:46 PM      Passed - Cr in normal range and within 360 days    Creatinine, Ser  Date Value Ref Range Status  05/17/2021 0.75 0.57 - 1.00 mg/dL Final         Passed - Completed PHQ-2 or PHQ-9 in the last 360 days      Passed - Valid encounter within last 12 months    Recent Outpatient Visits           7 months ago Lumbosacral spondylosis with radiculopathy   [place] [facility] at [place], Earley Abide, MD   8 months ago Piriformis syndrome, left   [facility] Primary Care & [facility] Medicine at [place], Earley Abide, MD   10 months ago Piriformis syndrome, left   [facility] Primary Care & Sports Medicine at [place], Earley Abide, MD   1 year ago Piriformis syndrome, left   [facility] Primary Care & Sports Medicine at [place], Earley Abide, MD   1 year ago Piriformis syndrome, left   [facility] Primary [place] at [facility], Earley Abide, MD       Future Appointments             In 2 days Zigmund Daniel, Earley Abide, MD [place] at [facility], [facility]

## 2022-04-11 ENCOUNTER — Ambulatory Visit (INDEPENDENT_AMBULATORY_CARE_PROVIDER_SITE_OTHER): Payer: No Typology Code available for payment source | Admitting: Family Medicine

## 2022-04-11 ENCOUNTER — Encounter: Payer: Self-pay | Admitting: Family Medicine

## 2022-04-11 VITALS — BP 120/80 | HR 84 | Ht 63.0 in | Wt 151.0 lb

## 2022-04-11 DIAGNOSIS — G8929 Other chronic pain: Secondary | ICD-10-CM

## 2022-04-11 DIAGNOSIS — G5702 Lesion of sciatic nerve, left lower limb: Secondary | ICD-10-CM | POA: Diagnosis not present

## 2022-04-11 DIAGNOSIS — F418 Other specified anxiety disorders: Secondary | ICD-10-CM

## 2022-04-11 DIAGNOSIS — M7918 Myalgia, other site: Secondary | ICD-10-CM

## 2022-04-11 DIAGNOSIS — M6281 Muscle weakness (generalized): Secondary | ICD-10-CM | POA: Diagnosis not present

## 2022-04-11 DIAGNOSIS — M4727 Other spondylosis with radiculopathy, lumbosacral region: Secondary | ICD-10-CM | POA: Diagnosis not present

## 2022-04-11 DIAGNOSIS — M7672 Peroneal tendinitis, left leg: Secondary | ICD-10-CM | POA: Diagnosis not present

## 2022-04-11 DIAGNOSIS — M5410 Radiculopathy, site unspecified: Secondary | ICD-10-CM

## 2022-04-11 MED ORDER — BUPROPION HCL ER (SR) 150 MG PO TB12: ORAL_TABLET | ORAL | 0 refills | Status: DC

## 2022-04-11 MED ORDER — DULOXETINE HCL 60 MG PO CPEP: 60.0000 mg | ORAL_CAPSULE | Freq: Every evening | ORAL | 0 refills | Status: DC

## 2022-04-11 MED ORDER — GABAPENTIN 400 MG PO CAPS: 400.0000 mg | ORAL_CAPSULE | Freq: Every day | ORAL | 0 refills | Status: DC

## 2022-04-15 DIAGNOSIS — G8929 Other chronic pain: Secondary | ICD-10-CM | POA: Insufficient documentation

## 2022-04-15 DIAGNOSIS — F418 Other specified anxiety disorders: Secondary | ICD-10-CM | POA: Insufficient documentation

## 2022-04-15 DIAGNOSIS — M5410 Radiculopathy, site unspecified: Secondary | ICD-10-CM | POA: Insufficient documentation

## 2022-04-15 NOTE — Assessment & Plan Note (Signed)
Chronic, in setting of multiple musculoskeletal complaints.  Known L5-S1 intervertebral narrowing from prior x-rays.  Plan as follows: - Rheumatologic panel - Cymbalta 60 mg - Gabapentin 400 mg nightly - Close follow-up 4 weeks - Pending response and lab results, can refer to her rheumatology, spine group, further titration of medications

## 2022-04-15 NOTE — Assessment & Plan Note (Signed)
PHQ and GAD scores noted, may pose a modifiable factor relating to her musculoskeletal pain.  Plan as follows: - Initiate Wellbutrin and titrated manner - Close follow-up in 4 weeks for reevaluation

## 2022-04-15 NOTE — Assessment & Plan Note (Signed)
See additional assessment(s) for plan details. 

## 2022-04-15 NOTE — Progress Notes (Signed)
Primary Care / Sports Medicine Office Visit  Patient Information:  Patient ID: Michaela Cox, female DOB: [birth year] Age: 42 y.o. MRN: [id number]   Michaela Cox is a pleasant 42 y.o. female presenting with the following:  Chief Complaint  Patient presents with   Medication Refill    Vitals:   04/11/22 1000  BP: 120/80  Pulse: 84  SpO2: 99%   Vitals:   04/11/22 1000  Weight: 151 lb (68.5 kg)  Height: 5\' 3"  (1.6 m)   Body mass index is 26.75 kg/m.  No results found.   Independent interpretation of notes and tests performed by another provider:   None  Procedures performed:   None  Pertinent History, Exam, Impression, and Recommendations:   Michaela Cox was seen today for medication refill.  Muscle weakness Assessment & Plan: Chronic, in setting of multiple musculoskeletal complaints.  Known L5-S1 intervertebral narrowing from prior x-rays.  Plan as follows: - Rheumatologic panel - Cymbalta 60 mg - Gabapentin 400 mg nightly - Close follow-up 4 weeks - Pending response and lab results, can refer to her rheumatology, spine group, further titration of medications  Orders: -     ANA 12Plus Profile, Do All RDL  Radicular pain of left lower extremity -     DULoxetine HCl; Take 1 capsule (60 mg total) by mouth every evening.  Dispense: 90 capsule; Refill: 0 -     Gabapentin; Take 1 capsule (400 mg total) by mouth at bedtime.  Dispense: 90 capsule; Refill: 0  Peroneal tendinitis, left -     DULoxetine HCl; Take 1 capsule (60 mg total) by mouth every evening.  Dispense: 90 capsule; Refill: 0 -     Gabapentin; Take 1 capsule (400 mg total) by mouth at bedtime.  Dispense: 90 capsule; Refill: 0  Piriformis syndrome, left -     DULoxetine HCl; Take 1 capsule (60 mg total) by mouth every evening.  Dispense: 90 capsule; Refill: 0 -     Gabapentin; Take 1 capsule (400 mg total) by mouth at bedtime.  Dispense: 90 capsule; Refill: 0  Chronic musculoskeletal pain -      ANA 12Plus Profile, Do All RDL  Lumbosacral spondylosis with radiculopathy Assessment & Plan: See additional assessment(s) for plan details.   Depression with anxiety Assessment & Plan: PHQ and GAD scores noted, may pose a modifiable factor relating to her musculoskeletal pain.  Plan as follows: - Initiate Wellbutrin and titrated manner - Close follow-up in 4 weeks for reevaluation   Other orders -     buPROPion HCl ER (SR); Take 150 mg PO once daily for 7 days then increase to 150 mg PO twice daily.  Dispense: 60 tablet; Refill: 0     Orders & Medications Meds ordered this encounter  Medications   DULoxetine (CYMBALTA) 60 MG capsule    Sig: Take 1 capsule (60 mg total) by mouth every evening.    Dispense:  90 capsule    Refill:  0    Courtesy refill given, appointment needed.   gabapentin (NEURONTIN) 400 MG capsule    Sig: Take 1 capsule (400 mg total) by mouth at bedtime.    Dispense:  90 capsule    Refill:  0   buPROPion (WELLBUTRIN SR) 150 MG 12 hr tablet    Sig: Take 150 mg PO once daily for 7 days then increase to 150 mg PO twice daily.    Dispense:  60 tablet    Refill:  0  Orders Placed This Encounter  Procedures   ANA 12Plus Profile, Do All RDL     Return in about 4 weeks (around 05/09/2022).     Jerrol Banana, MD, [facility]   Primary Care Sports Medicine Primary Care and Sports Medicine at [facility]

## 2022-04-18 ENCOUNTER — Encounter: Payer: Self-pay | Admitting: Family Medicine

## 2022-04-18 NOTE — Telephone Encounter (Signed)
Please review.  KP

## 2022-04-20 LAB — ANA 12PLUS PROFILE, DO ALL RDL
Anti-CCP Ab, IgG & IgA (RDL): 31 Units — ABNORMAL HIGH (ref <20)
Anti-Cardiolipin Ab, IgA (RDL): <12 APL U/mL (ref <12)
Anti-Cardiolipin Ab, IgG (RDL): <15 GPL U/mL (ref <15)
Anti-Cardiolipin Ab, IgM (RDL): 17 MPL U/mL (ref <13)
Anti-Centromere Ab (RDL): <1:40 {titer}
Anti-Chromatin Ab, IgG (RDL): <20 Units (ref <20)
Anti-La (SS-B) Ab (RDL): <20 Units (ref <20)
Anti-Nuclear Ab by IFA (RDL): POSITIVE — AB
Anti-Ro (SS-A) Ab (RDL): <20 Units (ref <20)
Anti-Scl-70 Ab (RDL): <20 Units (ref <20)
Anti-Sm Ab (RDL): <20 Units (ref <20)
Anti-TPO Ab (RDL): <9 IU/mL (ref <9.0)
Anti-U1 RNP Ab (RDL): <20 Units (ref <20)
Anti-dsDNA Ab by Farr(RDL): <8 IU/mL (ref <8.0)
C3 Complement (RDL): 127 mg/dL (ref 82–167)
C4 Complement (RDL): 34 mg/dL (ref 14–44)
Rheumatoid Factor by Turb RDL: <14 IU/mL (ref <14)

## 2022-04-20 LAB — ANA TITER AND PATTERN: Speckled Pattern: 1:40 {titer} — ABNORMAL HIGH

## 2022-04-24 ENCOUNTER — Other Ambulatory Visit: Payer: Self-pay

## 2022-04-24 DIAGNOSIS — R7689 Other specified abnormal immunological findings in serum: Secondary | ICD-10-CM

## 2022-04-24 DIAGNOSIS — R768 Other specified abnormal immunological findings in serum: Secondary | ICD-10-CM

## 2022-04-24 DIAGNOSIS — M65979 Unspecified synovitis and tenosynovitis, unspecified ankle and foot: Secondary | ICD-10-CM

## 2022-04-24 DIAGNOSIS — R7681 Abnormal rheumatoid factor and anti-citrullinated protein antibody without rheumatoid arthritis: Secondary | ICD-10-CM

## 2022-04-24 DIAGNOSIS — M659 Synovitis and tenosynovitis, unspecified: Secondary | ICD-10-CM

## 2022-04-24 NOTE — Progress Notes (Signed)
Referral placed.  KP 

## 2022-05-03 ENCOUNTER — Other Ambulatory Visit: Payer: Self-pay | Admitting: Family Medicine

## 2022-05-03 NOTE — Telephone Encounter (Signed)
Requested Prescriptions  Pending Prescriptions Disp Refills   buPROPion (WELLBUTRIN SR) 150 MG 12 hr tablet [Pharmacy Med Name: BUPROPION HCL SR 150 MG TABLET] 180 tablet 1    Sig: TAKE 1 TABLET BY MOUTH DAILY FOR 7 DAYS THEN INCREASE TO 1 TAB TWICE DAILY     Psychiatry: Antidepressants - bupropion Passed - 05/03/2022  1:30 PM      Passed - Cr in normal range and within 360 days    Creatinine, Ser  Date Value Ref Range Status  05/17/2021 0.75 0.57 - 1.00 mg/dL Final         Passed - AST in normal range and within 360 days    AST  Date Value Ref Range Status  05/17/2021 21 0 - 40 IU/L Final         Passed - ALT in normal range and within 360 days    ALT  Date Value Ref Range Status  05/17/2021 18 0 - 32 IU/L Final         Passed - Completed PHQ-2 or PHQ-9 in the last 360 days      Passed - Last BP in normal range    BP Readings from Last 1 Encounters:  04/11/22 120/80         Passed - Valid encounter within last 6 months    Recent Outpatient Visits           3 weeks ago Muscle weakness   [place] [facility] at [facility] Ashley Royalty, Ocie Bob, MD   8 months ago Lumbosacral spondylosis with radiculopathy   [facility] at [facility], Ocie Bob, MD   9 months ago Piriformis syndrome, left   [facility] at [facility], Ocie Bob, MD   11 months ago Piriformis syndrome, left   [facility] at [facility], Ocie Bob, MD   1 year ago Piriformis syndrome, left   [facility] Primary Care & Sports Medicine at [facility], Ocie Bob, MD       Future Appointments             In 1 month Ashley Royalty, Ocie Bob, MD [facility] Primary Care & Sports Medicine at [facility], [facility]

## 2022-06-12 ENCOUNTER — Encounter: Payer: No Typology Code available for payment source | Admitting: Family Medicine

## 2022-07-04 ENCOUNTER — Encounter: Payer: Self-pay | Admitting: Family Medicine

## 2022-07-04 ENCOUNTER — Other Ambulatory Visit: Payer: Self-pay | Admitting: Family Medicine

## 2022-07-04 ENCOUNTER — Ambulatory Visit (INDEPENDENT_AMBULATORY_CARE_PROVIDER_SITE_OTHER): Payer: No Typology Code available for payment source | Admitting: Family Medicine

## 2022-07-04 VITALS — BP 120/80 | HR 100 | Ht 63.0 in | Wt 161.0 lb

## 2022-07-04 DIAGNOSIS — F418 Other specified anxiety disorders: Secondary | ICD-10-CM

## 2022-07-04 DIAGNOSIS — M5410 Radiculopathy, site unspecified: Secondary | ICD-10-CM

## 2022-07-04 DIAGNOSIS — Z13220 Encounter for screening for lipoid disorders: Secondary | ICD-10-CM

## 2022-07-04 DIAGNOSIS — Z0000 Encounter for general adult medical examination without abnormal findings: Secondary | ICD-10-CM

## 2022-07-04 DIAGNOSIS — Z7689 Persons encountering health services in other specified circumstances: Secondary | ICD-10-CM

## 2022-07-04 DIAGNOSIS — K51919 Ulcerative colitis, unspecified with unspecified complications: Secondary | ICD-10-CM

## 2022-07-04 DIAGNOSIS — E559 Vitamin D deficiency, unspecified: Secondary | ICD-10-CM | POA: Diagnosis not present

## 2022-07-04 DIAGNOSIS — Z6828 Body mass index (BMI) 28.0-28.9, adult: Secondary | ICD-10-CM

## 2022-07-04 MED ORDER — AUVELITY 45-105 MG PO TBCR
EXTENDED_RELEASE_TABLET | ORAL | 0 refills | Status: DC
Start: 2022-07-04 — End: 2022-07-15

## 2022-07-04 MED ORDER — WEGOVY 0.25 MG/0.5ML ~~LOC~~ SOAJ
0.2500 mg | SUBCUTANEOUS | 3 refills | Status: DC
Start: 2022-07-04 — End: 2022-09-03

## 2022-07-04 MED ORDER — GABAPENTIN 600 MG PO TABS
600.0000 mg | ORAL_TABLET | Freq: Every day | ORAL | 0 refills | Status: DC
Start: 2022-07-04 — End: 2022-09-11

## 2022-07-04 NOTE — Assessment & Plan Note (Signed)
Has established with rheumatology, was prescribed sulfasalazine but has not started this yet.  She plans to do so once obtaining Rx from pharmacy.

## 2022-07-04 NOTE — Patient Instructions (Addendum)
-   Obtain fasting labs with orders provided (can have water or black coffee but otherwise no food or drink x 8 hours before labs) - Review information provided - Attend eye doctor annually, dentist every 6 months, work towards or maintain 30 minutes of moderate intensity physical activity at least 5 days per week, and consume a balanced diet - Return for follow-up in 2 months - Contact us for any questions between now and then  Additionally: - Start new gabapentin 600 mg nightly dose - Start Wegovy (semaglutide) weekly - Referral coordinator will contact to schedule visit with medical weight management -Touch base with your rheumatologist about starting medication they prescribed  Transitioning from Wellbutrin to Auvelity Week 1: Decrease Wellbutrin (bupropion) from twice daily to once daily x 1 week Week 2: Stop Wellbutrin completely Week 3: Start Auvelity daily x 3 days, after 3 days dose Auvelity twice daily (doses must be at least 8 hours apart)

## 2022-07-04 NOTE — Assessment & Plan Note (Signed)
Struggling with weight gain despite regular exercise and significant dietary modifications compounded by OCP usage.  We discussed treatment strategies and plan as follows:  - Rx for [facility] given BMI and comorbid health conditions - Referral placed to medical weight management for further optimization - Return in 8 weeks

## 2022-07-04 NOTE — Assessment & Plan Note (Signed)
Ongoing, without adequate control, in the setting of multiple health-related conditions.  We extensively reviewed treatment options, patient is amenable to the following shared plan:  - Wean from Wellbutrin 150 mg twice daily - Initiate Auvelity after fully weaning from Wellbutrin with associated titration guidelines for Auvelity provided - Continue duloxetine - Follow-up in 8 weeks

## 2022-07-04 NOTE — Assessment & Plan Note (Signed)
Annual examination completed, risk stratification labs ordered, anticipatory guidance provided.  We will follow labs once resulted. 

## 2022-07-04 NOTE — Assessment & Plan Note (Signed)
>>  ASSESSMENT AND PLAN FOR RADICULAR PAIN OF LEFT LOWER EXTREMITY WRITTEN ON 07/04/2022  1:04 PM BY Monteen Toops, Ocie Bob, MD  Ongoing symptomatology localized about the left foot that has persisted despite surgical interventions.  Plan as follows: - Titrate gabapentin to 600 mg nightly - Close follow-up in 8 weeks

## 2022-07-04 NOTE — Assessment & Plan Note (Signed)
Ongoing symptomatology localized about the left foot that has persisted despite surgical interventions.  Plan as follows: - Titrate gabapentin to 600 mg nightly - Close follow-up in 8 weeks

## 2022-07-04 NOTE — Progress Notes (Signed)
Annual Physical Exam Visit  Patient Information:  Patient ID: Michaela Cox, female DOB: 1980-11-18 Age: 42 y.o. MRN: [id number]   Subjective:   CC: Annual Physical Exam  HPI:  Michaela Cox is here for their annual physical.  I reviewed the past medical history, family history, social history, surgical history, and allergies today and changes were made as necessary.  Please see the problem list section below for additional details.  Past Medical History: Past Medical History:  Diagnosis Date   Eustachian tube disorder    GERD (gastroesophageal reflux disease)    Impingement syndrome of right shoulder region 03/01/2019   Lumbosacral spondylosis with radiculopathy 12/26/2020   Motion sickness    Peroneal tendinitis, left 03/22/2021   Plantar fasciitis, left 03/22/2021   PONV (postoperative nausea and vomiting)    Ulcerative colitis (HCC)    Past Surgical History: Past Surgical History:  Procedure Laterality Date   ANKLE SURGERY Left    COLONOSCOPY  12/2016   COLONOSCOPY N/A 10/08/2021   Procedure: COLONOSCOPY;  Surgeon: Midge Minium, MD;  [facility]: [facility];  Service: Endoscopy;  Laterality: N/A;   Family History: Family History  Problem Relation Age of Onset   Cancer Mother        gallbladder   Liver disease Mother    GER disease Sister    Asthma Daughter    Lung cancer Maternal Aunt    Hypertension Maternal Grandmother    Lung cancer Maternal Grandfather    Hypertension Paternal Grandfather    Diabetes Paternal Grandfather    Allergies: No Known Allergies Health Maintenance: Health Maintenance  Topic Date Due   INFLUENZA VACCINE  08/08/2022   DTaP/Tdap/Td (2 - Td or Tdap) 06/16/2023   PAP SMEAR-Modifier  09/21/2023   Hepatitis C Screening  Completed   HIV Screening  Completed   HPV VACCINES  Aged Out   Colonoscopy  Discontinued   COVID-19 Vaccine  Discontinued    HM Colonoscopy          Discontinued - Colonoscopy  Discontinued       Frequency changed to Never automatically (Topic No Longer Applies)   Only the first 1 history entries have been loaded, but more history exists.           Medications: Current Outpatient Medications on File Prior to Visit  Medication Sig Dispense Refill   desogestrel-ethinyl estradiol (MIRCETTE) 0.15-0.02/0.01 MG (21/5) tablet Take 1 tablet by mouth daily.     DULoxetine (CYMBALTA) 60 MG capsule Take 1 capsule (60 mg total) by mouth every evening. 90 capsule 0   esomeprazole (NEXIUM) 20 MG capsule Take by mouth.     Multiple Vitamin (ONE DAILY) tablet Take 1 tablet by mouth daily.     Turmeric (QC TUMERIC COMPLEX PO) Take by mouth.     VITAMIN D PO Take by mouth daily.     No current facility-administered medications on file prior to visit.    Review of Systems: No headache, visual changes, nausea, vomiting, diarrhea, constipation, dizziness, abdominal pain, skin rash, fevers, chills, night sweats, swollen lymph nodes, weight loss, chest pain, body aches, joint swelling, muscle aches, shortness of breath, mood changes, visual or auditory hallucinations reported.  Objective:   Vitals:   07/04/22 0904  BP: 120/80  Pulse: 100  SpO2: 98%   Vitals:   07/04/22 0904  Weight: 161 lb (73 kg)  Height: 5\' 3"  (1.6 m)   Body mass index is 28.52 kg/m.  General: Well Developed, well  nourished, and in no acute distress.  Neuro: Alert and oriented x3, extra-ocular muscles intact, sensation grossly intact. Cranial nerves II through XII are grossly intact, motor, sensory, and coordinative functions are intact. HEENT: Normocephalic, atraumatic, pupils equal Skin: Warm and dry, no rashes noted.  Cardiac: Regular rate and rhythm, no murmurs rubs or gallops. No peripheral edema. Pulses symmetric. Respiratory: Clear to auscultation bilaterally. Not using accessory muscles, speaking in full sentences.  Abdominal: Soft, nontender, nondistended, positive bowel sounds, no masses, no  organomegaly. Musculoskeletal: Shoulder, elbow, wrist, hip, knee, ankle stable, and with full range of motion.  Impression and Recommendations:   The patient was counselled, risk factors were discussed, and anticipatory guidance given.  Problem List Items Addressed This Visit       Digestive   Ulcerative colitis (HCC)    Has established with rheumatology, was prescribed sulfasalazine but has not started this yet.  She plans to do so once obtaining Rx from pharmacy.        Other   Healthcare maintenance - Primary    Annual examination completed, risk stratification labs ordered, anticipatory guidance provided.  We will follow labs once resulted.      Relevant Orders   CBC   Comprehensive metabolic panel   Lipid panel   TSH   VITAMIN D 25 Hydroxy (Vit-D Deficiency, Fractures)   Encounter for weight management    Struggling with weight gain despite regular exercise and significant dietary modifications compounded by OCP usage.  We discussed treatment strategies and plan as follows:  - Rx for [facility] given BMI and comorbid health conditions - Referral placed to medical weight management for further optimization - Return in 8 weeks      Relevant Medications   Semaglutide-Weight Management (WEGOVY) 0.25 MG/0.5ML SOAJ   Other Relevant Orders   Amb Ref to Medical Weight Management   Depression with anxiety    Ongoing, without adequate control, in the setting of multiple health-related conditions.  We extensively reviewed treatment options, patient is amenable to the following shared plan:  - Wean from Wellbutrin 150 mg twice daily - Initiate Auvelity after fully weaning from Wellbutrin with associated titration guidelines for Auvelity provided - Continue duloxetine - Follow-up in 8 weeks      Relevant Medications   Dextromethorphan-buPROPion ER (AUVELITY) 45-105 MG TBCR   Annual physical exam   Relevant Orders   CBC   Comprehensive metabolic panel   Lipid panel   TSH    VITAMIN D 25 Hydroxy (Vit-D Deficiency, Fractures)   Other Visit Diagnoses     Screening for lipoid disorders       Relevant Orders   Comprehensive metabolic panel   Lipid panel   Vitamin D deficiency       Relevant Orders   VITAMIN D 25 Hydroxy (Vit-D Deficiency, Fractures)   Radicular pain of left lower extremity       Relevant Medications   gabapentin (NEURONTIN) 600 MG tablet   Body mass index (BMI) 28.0-28.9, adult       Relevant Medications   Semaglutide-Weight Management (WEGOVY) 0.25 MG/0.5ML SOAJ   Other Relevant Orders   Amb Ref to Medical Weight Management        Orders & Medications Medications:  Meds ordered this encounter  Medications   gabapentin (NEURONTIN) 600 MG tablet    Sig: Take 1 tablet (600 mg total) by mouth at bedtime.    Dispense:  60 tablet    Refill:  0   Dextromethorphan-buPROPion ER (AUVELITY)  45-105 MG TBCR    Sig: Take 1 tablet daily x 3 days then increase to 1 tablet twice a day, separate doses by at least 8 hours    Dispense:  180 tablet    Refill:  0   Semaglutide-Weight Management (WEGOVY) 0.25 MG/0.5ML SOAJ    Sig: Inject 0.25 mg into the skin once a week.    Dispense:  2 mL    Refill:  3   Orders Placed This Encounter  Procedures   CBC   Comprehensive metabolic panel   Lipid panel   TSH   VITAMIN D 25 Hydroxy (Vit-D Deficiency, Fractures)   Amb Ref to Medical Weight Management     Return in about 2 months (around 09/03/2022) for follow-up.    Jerrol Banana, MD, [facility]   Primary Care Sports Medicine Primary Care and Sports Medicine at [facility]

## 2022-07-05 LAB — COMPREHENSIVE METABOLIC PANEL
ALT: 17 IU/L (ref 0–32)
AST: 21 IU/L (ref 0–40)
Albumin: 4.3 g/dL (ref 3.9–4.9)
Alkaline Phosphatase: 60 IU/L (ref 44–121)
BUN/Creatinine Ratio: 20 (ref 9–23)
BUN: 18 mg/dL (ref 6–24)
Bilirubin Total: 0.2 mg/dL (ref 0.0–1.2)
CO2: 25 mmol/L (ref 20–29)
Calcium: 9 mg/dL (ref 8.7–10.2)
Chloride: 102 mmol/L (ref 96–106)
Creatinine, Ser: 0.91 mg/dL (ref 0.57–1.00)
Globulin, Total: 2.8 g/dL (ref 1.5–4.5)
Glucose: 89 mg/dL (ref 70–99)
Potassium: 4.8 mmol/L (ref 3.5–5.2)
Sodium: 140 mmol/L (ref 134–144)
Total Protein: 7.1 g/dL (ref 6.0–8.5)
eGFR: 81 mL/min/{1.73_m2} (ref >59)

## 2022-07-05 LAB — LIPID PANEL
Chol/HDL Ratio: 2.6 ratio (ref 0.0–4.4)
Cholesterol, Total: 190 mg/dL (ref 100–199)
HDL: 73 mg/dL (ref >39)
LDL Chol Calc (NIH): 101 mg/dL — ABNORMAL HIGH (ref 0–99)
Triglycerides: 87 mg/dL (ref 0–149)
VLDL Cholesterol Cal: 16 mg/dL (ref 5–40)

## 2022-07-05 LAB — CBC
Hematocrit: 39.6 % (ref 34.0–46.6)
Hemoglobin: 12.9 g/dL (ref 11.1–15.9)
MCH: 30.8 pg (ref 26.6–33.0)
MCHC: 32.6 g/dL (ref 31.5–35.7)
MCV: 95 fL (ref 79–97)
Platelets: 238 10*3/uL (ref 150–450)
RBC: 4.19 x10E6/uL (ref 3.77–5.28)
RDW: 12.6 % (ref 11.7–15.4)
WBC: 6.2 10*3/uL (ref 3.4–10.8)

## 2022-07-05 LAB — TSH: TSH: 2.16 u[IU]/mL (ref 0.450–4.500)

## 2022-07-05 LAB — VITAMIN D 25 HYDROXY (VIT D DEFICIENCY, FRACTURES): Vit D, 25-Hydroxy: 54.2 ng/mL (ref 30.0–100.0)

## 2022-07-05 NOTE — Telephone Encounter (Signed)
Requested medication (s) are due for refill today: No  Requested medication (s) are on the active medication list: Yes  Last refill:  07/04/22  Future visit scheduled: Yes  Notes to clinic:  Pharmacy reports not covered by insurance, asking for alternative.    Requested Prescriptions  Pending Prescriptions Disp Refills   WEGOVY 0.25 MG/0.5ML SOAJ [Pharmacy Med Name: WEGOVY 0.25 MG/0.5 ML PEN]  3    Sig: INJECT 0.25MG  INTO THE SKIN ONE TIME PER WEEK     Endocrinology:  Diabetes - GLP-1 Receptor Agonists - semaglutide Failed - 07/04/2022  1:20 PM      Failed - HBA1C in normal range and within 180 days    No results found for: "HGBA1C", "LABA1C"       Failed - Cr in normal range and within 360 days    Creatinine, Ser  Date Value Ref Range Status  07/04/2022 0.91 0.57 - 1.00 mg/dL Final         Passed - Valid encounter within last 6 months    Recent Outpatient Visits           Yesterday Healthcare maintenance   [facility] Primary Care & Sports Medicine at [facility], Ocie Bob, MD   2 months ago Muscle weakness   [facility] Primary Care & Sports Medicine at [facility], Ocie Bob, MD   10 months ago Lumbosacral spondylosis with radiculopathy   [facility] Health Primary Care & Sports Medicine at [facility], Ocie Bob, MD   11 months ago Piriformis syndrome, left   [facility] Primary Care & Sports Medicine at [facility], Ocie Bob, MD   1 year ago Piriformis syndrome, left   [facility] Health Primary Care & Sports Medicine at [facility], Ocie Bob, MD       Future Appointments             In 2 months Ashley Royalty, Ocie Bob, MD [facility] Health Primary Care & Sports Medicine at [facility], [facility]

## 2022-07-05 NOTE — Telephone Encounter (Signed)
Please advise 

## 2022-07-05 NOTE — Telephone Encounter (Signed)
Requested medication (s) are due for refill today: No  Requested medication (s) are on the active medication list: Yes  Last refill:  07/04/22  Future visit scheduled: Yes  Notes to clinic:  Pharmacy reports not covered by insurance, asking for alternative.    Requested Prescriptions  Pending Prescriptions Disp Refills   citalopram (CELEXA) 10 MG tablet [Pharmacy Med Name: CITALOPRAM HBR 10 MG TABLET]  0     Psychiatry:  Antidepressants - SSRI Passed - 07/04/2022  1:20 PM      Passed - Completed PHQ-2 or PHQ-9 in the last 360 days      Passed - Valid encounter within last 6 months    Recent Outpatient Visits           Yesterday Healthcare maintenance   [facility] Health Primary Care & Sports Medicine at [facility], Ocie Bob, MD   2 months ago Muscle weakness   [facility] Primary Care & Sports Medicine at [facility], Ocie Bob, MD   10 months ago Lumbosacral spondylosis with radiculopathy   [facility] Health Primary Care & Sports Medicine at [facility], Ocie Bob, MD   11 months ago Piriformis syndrome, left   [facility] Health Primary Care & Sports Medicine at [facility], Ocie Bob, MD   1 year ago Piriformis syndrome, left   [facility] Health Primary Care & Sports Medicine at [facility], Ocie Bob, MD       Future Appointments             In 2 months Ashley Royalty, Ocie Bob, MD [facility] Health Primary Care & Sports Medicine at [facility], [facility]

## 2022-07-05 NOTE — Telephone Encounter (Signed)
fyi

## 2022-07-12 NOTE — Telephone Encounter (Signed)
Pharmacy comment: Alternative Requested:DRUG NOT ON FORMULARY. ?

## 2022-07-12 NOTE — Telephone Encounter (Signed)
Called pt left VM to call back.  KP 

## 2022-07-12 NOTE — Telephone Encounter (Signed)
Pharmacy comment: Alternative Requested:NOT COVERED.

## 2022-07-15 ENCOUNTER — Other Ambulatory Visit: Payer: Self-pay

## 2022-07-15 DIAGNOSIS — F418 Other specified anxiety disorders: Secondary | ICD-10-CM

## 2022-07-15 MED ORDER — AUVELITY 45-105 MG PO TBCR
EXTENDED_RELEASE_TABLET | ORAL | 0 refills | Status: DC
Start: 2022-07-15 — End: 2022-09-03

## 2022-07-15 NOTE — Telephone Encounter (Signed)
Please advise 

## 2022-07-29 ENCOUNTER — Encounter: Payer: Self-pay | Admitting: Family Medicine

## 2022-07-31 ENCOUNTER — Encounter: Payer: Self-pay | Admitting: Family Medicine

## 2022-08-01 NOTE — Telephone Encounter (Signed)
Please review.  KP

## 2022-08-01 NOTE — Telephone Encounter (Signed)
fyi

## 2022-08-06 NOTE — Telephone Encounter (Signed)
RX to expensive

## 2022-09-03 ENCOUNTER — Ambulatory Visit: Payer: No Typology Code available for payment source | Admitting: Family Medicine

## 2022-09-03 ENCOUNTER — Encounter: Payer: Self-pay | Admitting: Family Medicine

## 2022-09-03 VITALS — BP 120/76 | HR 86 | Ht 63.0 in | Wt 164.0 lb

## 2022-09-03 DIAGNOSIS — M7672 Peroneal tendinitis, left leg: Secondary | ICD-10-CM

## 2022-09-03 DIAGNOSIS — F418 Other specified anxiety disorders: Secondary | ICD-10-CM

## 2022-09-03 DIAGNOSIS — Z23 Encounter for immunization: Secondary | ICD-10-CM

## 2022-09-03 DIAGNOSIS — G5702 Lesion of sciatic nerve, left lower limb: Secondary | ICD-10-CM

## 2022-09-03 DIAGNOSIS — M5410 Radiculopathy, site unspecified: Secondary | ICD-10-CM

## 2022-09-03 DIAGNOSIS — Z7689 Persons encountering health services in other specified circumstances: Secondary | ICD-10-CM | POA: Diagnosis not present

## 2022-09-03 MED ORDER — BUPROPION HCL ER (XL) 300 MG PO TB24
300.0000 mg | ORAL_TABLET | Freq: Every day | ORAL | 3 refills | Status: DC
Start: 2022-09-03 — End: 2023-05-05

## 2022-09-03 MED ORDER — TOPIRAMATE 25 MG PO TABS
25.0000 mg | ORAL_TABLET | Freq: Every day | ORAL | 1 refills | Status: AC
Start: 2022-09-03 — End: ?

## 2022-09-03 MED ORDER — SERTRALINE HCL 50 MG PO TABS
50.0000 mg | ORAL_TABLET | Freq: Every day | ORAL | 1 refills | Status: AC
Start: 2022-09-03 — End: ?

## 2022-09-03 NOTE — Patient Instructions (Addendum)
-   Stop duloxetine - Start sertraline daily - Start topiramate daily - Start Wellbutrin after 2 weeks - Return as scheduled

## 2022-09-04 ENCOUNTER — Other Ambulatory Visit: Payer: Self-pay | Admitting: Family Medicine

## 2022-09-04 DIAGNOSIS — M5410 Radiculopathy, site unspecified: Secondary | ICD-10-CM

## 2022-09-04 DIAGNOSIS — G5702 Lesion of sciatic nerve, left lower limb: Secondary | ICD-10-CM

## 2022-09-04 DIAGNOSIS — M7672 Peroneal tendinitis, left leg: Secondary | ICD-10-CM

## 2022-09-05 ENCOUNTER — Encounter: Payer: Self-pay | Admitting: Family Medicine

## 2022-09-05 NOTE — Telephone Encounter (Signed)
Requested by interface surescripts. Medication discontinued 09/03/22 by PCP.  Requested Prescriptions  Refused Prescriptions Disp Refills   DULoxetine (CYMBALTA) 60 MG capsule [Pharmacy Med Name: DULOXETINE HCL DR 60 MG CAP] 90 capsule 0    Sig: TAKE 1 CAPSULE (60 MG TOTAL) BY MOUTH EVERY EVENING.     Psychiatry: Antidepressants - SNRI - duloxetine Passed - 09/04/2022  7:01 PM      Passed - Cr in normal range and within 360 days    Creatinine, Ser  Date Value Ref Range Status  07/04/2022 0.91 0.57 - 1.00 mg/dL Final         Passed - eGFR is 30 or above and within 360 days    eGFR  Date Value Ref Range Status  07/04/2022 81 >59 mL/min/1.73 Final         Passed - Completed PHQ-2 or PHQ-9 in the last 360 days      Passed - Last BP in normal range    BP Readings from Last 1 Encounters:  09/03/22 120/76         Passed - Valid encounter within last 6 months    Recent Outpatient Visits           2 days ago Flu vaccine need   [facility] Health Primary Care & Sports Medicine at [facility], Ocie Bob, MD   2 months ago Healthcare maintenance   [facility] Health Primary Care & Sports Medicine at [facility], Ocie Bob, MD   4 months ago Muscle weakness   [facility] Primary Care & Sports Medicine at [facility], Ocie Bob, MD   1 year ago Lumbosacral spondylosis with radiculopathy   [facility] Health Primary Care & Sports Medicine at [facility], Ocie Bob, MD   1 year ago Piriformis syndrome, left   [facility] Health Primary Care & Sports Medicine at [facility], Ocie Bob, MD       Future Appointments             In 1 month Ashley Royalty, Ocie Bob, MD [facility] Health Primary Care & Sports Medicine at [facility], [facility]

## 2022-09-06 NOTE — Assessment & Plan Note (Signed)
Patient has been on duloxetine for primary pain control but never achieved this nor mood related improvements. Discussed transition off of duloxetine with initiation of sertraline to follow.  Plan: - Stop duloxetine - Start sertraline - Return as scheduled

## 2022-09-06 NOTE — Assessment & Plan Note (Signed)
Unable to obtain coverage for [facility]. We reviewed alternate regimen.  Plan: - Initiate Wellbutrin after 2 weeks - Start topiramate daily - Return as scheduled

## 2022-09-06 NOTE — Progress Notes (Signed)
     Primary Care / Sports Medicine Office Visit  Patient Information:  Patient ID: Michaela Cox, female DOB: [birth year] Age: 42 y.o. MRN: [id number]   Michaela Cox is a pleasant 42 y.o. female presenting with the following:  No chief complaint on file.   Vitals:   09/03/22 0820  BP: 120/76  Pulse: 86  SpO2: 98%   Vitals:   09/03/22 0820  Weight: 164 lb (74.4 kg)  Height: 5\' 3"  (1.6 m)   Body mass index is 29.05 kg/m.  No results found.   Independent interpretation of notes and tests performed by another provider:   None  Procedures performed:   None  Pertinent History, Exam, Impression, and Recommendations:   Depression with anxiety Assessment & Plan: Patient has been on duloxetine for primary pain control but never achieved this nor mood related improvements. Discussed transition off of duloxetine with initiation of sertraline to follow.  Plan: - Stop duloxetine - Start sertraline - Return as scheduled  Orders: -     Sertraline HCl; Take 1 tablet (50 mg total) by mouth daily.  Dispense: 30 tablet; Refill: 1  Encounter for weight management Assessment & Plan: Unable to obtain coverage for [facility]. We reviewed alternate regimen.  Plan: - Initiate Wellbutrin after 2 weeks - Start topiramate daily - Return as scheduled  Orders: -     buPROPion HCl ER (XL); Take 1 tablet (300 mg total) by mouth daily.  Dispense: 90 tablet; Refill: 3 -     Topiramate; Take 1 tablet (25 mg total) by mouth daily.  Dispense: 30 tablet; Refill: 1  Flu vaccine need -     Flu vaccine trivalent PF, 6mos and older(Flulaval,Afluria,Fluarix,Fluzone)     Orders & Medications Meds ordered this encounter  Medications   buPROPion (WELLBUTRIN XL) 300 MG 24 hr tablet    Sig: Take 1 tablet (300 mg total) by mouth daily.    Dispense:  90 tablet    Refill:  3   sertraline (ZOLOFT) 50 MG tablet    Sig: Take 1 tablet (50 mg total) by mouth daily.    Dispense:  30 tablet     Refill:  1   topiramate (TOPAMAX) 25 MG tablet    Sig: Take 1 tablet (25 mg total) by mouth daily.    Dispense:  30 tablet    Refill:  1   Orders Placed This Encounter  Procedures   Flu vaccine trivalent PF, 6mos and older(Flulaval,Afluria,Fluarix,Fluzone)     No follow-ups on file.     Jerrol Banana, MD, [facility]   Primary Care Sports Medicine Primary Care and Sports Medicine at [facility]

## 2022-09-08 ENCOUNTER — Other Ambulatory Visit: Payer: Self-pay | Admitting: Family Medicine

## 2022-09-08 DIAGNOSIS — M5410 Radiculopathy, site unspecified: Secondary | ICD-10-CM

## 2022-09-11 NOTE — Telephone Encounter (Signed)
Requested Prescriptions  Pending Prescriptions Disp Refills   gabapentin (NEURONTIN) 600 MG tablet [Pharmacy Med Name: GABAPENTIN 600 MG TABLET] 90 tablet 0    Sig: TAKE 1 TABLET BY MOUTH AT BEDTIME.     Neurology: Anticonvulsants - gabapentin Passed - 09/08/2022  9:21 PM      Passed - Cr in normal range and within 360 days    Creatinine, Ser  Date Value Ref Range Status  07/04/2022 0.91 0.57 - 1.00 mg/dL Final         Passed - Completed PHQ-2 or PHQ-9 in the last 360 days      Passed - Valid encounter within last 12 months    Recent Outpatient Visits           1 week ago Depression with anxiety   [place] Primary Care & Sports Medicine at [facility], Ocie Bob, MD   2 months ago Healthcare maintenance   [facility] Primary Care & Sports Medicine at [facility], Ocie Bob, MD   5 months ago Muscle weakness   [facility] Health Primary Care & Sports Medicine at [facility], Ocie Bob, MD   1 year ago Lumbosacral spondylosis with radiculopathy   [facility] Health Primary Care & Sports Medicine at [facility], Ocie Bob, MD   1 year ago Piriformis syndrome, left   [facility] Health Primary Care & Sports Medicine at [facility], Ocie Bob, MD       Future Appointments             In 3 weeks Ashley Royalty, Ocie Bob, MD [facility] Health Primary Care & Sports Medicine at [facility], [facility]

## 2022-09-27 ENCOUNTER — Other Ambulatory Visit: Payer: Self-pay | Admitting: Family Medicine

## 2022-09-27 DIAGNOSIS — Z7689 Persons encountering health services in other specified circumstances: Secondary | ICD-10-CM

## 2022-09-27 DIAGNOSIS — F418 Other specified anxiety disorders: Secondary | ICD-10-CM

## 2022-09-30 NOTE — Telephone Encounter (Signed)
Requested Prescriptions  Refused Prescriptions Disp Refills   sertraline (ZOLOFT) 50 MG tablet [Pharmacy Med Name: SERTRALINE HCL 50 MG TABLET] 90 tablet 1    Sig: TAKE 1 TABLET BY MOUTH EVERY DAY     Psychiatry:  Antidepressants - SSRI - sertraline Passed - 09/27/2022 12:31 PM      Passed - AST in normal range and within 360 days    AST  Date Value Ref Range Status  07/04/2022 21 0 - 40 IU/L Final         Passed - ALT in normal range and within 360 days    ALT  Date Value Ref Range Status  07/04/2022 17 0 - 32 IU/L Final         Passed - Completed PHQ-2 or PHQ-9 in the last 360 days      Passed - Valid encounter within last 6 months    Recent Outpatient Visits           3 weeks ago Depression with anxiety   [place] Primary Care & Sports Medicine at [facility], Ocie Bob, MD   2 months ago Healthcare maintenance   [facility] Health Primary Care & Sports Medicine at [facility], Ocie Bob, MD   5 months ago Muscle weakness   [facility] Health Primary Care & Sports Medicine at [facility], Ocie Bob, MD   1 year ago Lumbosacral spondylosis with radiculopathy   [place] Primary Care & Sports Medicine at [facility], Ocie Bob, MD   1 year ago Piriformis syndrome, left   [facility] Health Primary Care & Sports Medicine at [facility], Ocie Bob, MD       Future Appointments             In 1 week Ashley Royalty, Ocie Bob, MD [facility] Health Primary Care & Sports Medicine at [facility], [facility]             topiramate (TOPAMAX) 25 MG tablet [Pharmacy Med Name: TOPIRAMATE 25 MG TABLET] 90 tablet 1    Sig: TAKE 1 TABLET (25 MG TOTAL) BY MOUTH DAILY.     Neurology: Anticonvulsants - topiramate & zonisamide Passed - 09/27/2022 12:31 PM      Passed - Cr in normal range and within 360 days    Creatinine, Ser  Date Value Ref Range Status  07/04/2022 0.91 0.57 - 1.00 mg/dL Final         Passed - CO2 in normal range and within 360 days     CO2  Date Value Ref Range Status  07/04/2022 25 20 - 29 mmol/L Final         Passed - ALT in normal range and within 360 days    ALT  Date Value Ref Range Status  07/04/2022 17 0 - 32 IU/L Final         Passed - AST in normal range and within 360 days    AST  Date Value Ref Range Status  07/04/2022 21 0 - 40 IU/L Final         Passed - Completed PHQ-2 or PHQ-9 in the last 360 days      Passed - Valid encounter within last 12 months    Recent Outpatient Visits           3 weeks ago Depression with anxiety   [place] Primary Care & Sports Medicine at [facility], Ocie Bob, MD   2 months ago Healthcare maintenance  [facility] Health Primary Care & Sports Medicine at [facility], Ocie Bob, MD   5 months ago Muscle weakness   [facility] Primary Care & Sports Medicine at [facility], Ocie Bob, MD   1 year ago Lumbosacral spondylosis with radiculopathy   [facility] Health Primary Care & Sports Medicine at [facility], Ocie Bob, MD   1 year ago Piriformis syndrome, left   [facility] Health Primary Care & Sports Medicine at [facility], Ocie Bob, MD       Future Appointments             In 1 week Ashley Royalty, Ocie Bob, MD [facility] Health Primary Care & Sports Medicine at [facility], [facility]

## 2022-10-08 ENCOUNTER — Encounter: Payer: Self-pay | Admitting: Family Medicine

## 2022-10-08 ENCOUNTER — Telehealth (INDEPENDENT_AMBULATORY_CARE_PROVIDER_SITE_OTHER): Payer: No Typology Code available for payment source | Admitting: Family Medicine

## 2022-10-08 ENCOUNTER — Ambulatory Visit: Payer: No Typology Code available for payment source | Admitting: Family Medicine

## 2022-10-08 DIAGNOSIS — F418 Other specified anxiety disorders: Secondary | ICD-10-CM

## 2022-10-08 DIAGNOSIS — Z7689 Persons encountering health services in other specified circumstances: Secondary | ICD-10-CM | POA: Diagnosis not present

## 2022-10-08 MED ORDER — SERTRALINE HCL 100 MG PO TABS
100.0000 mg | ORAL_TABLET | Freq: Every day | ORAL | 0 refills | Status: DC
Start: 2022-10-08 — End: 2023-01-13

## 2022-10-08 MED ORDER — TOPIRAMATE 50 MG PO TABS
50.0000 mg | ORAL_TABLET | Freq: Every day | ORAL | 0 refills | Status: DC
Start: 2022-10-08 — End: 2023-01-14

## 2022-10-08 NOTE — Assessment & Plan Note (Signed)
Has been on Wellbutrin and topiramate, maintains high level of exercise at least 5 times daily, denies any significant weight loss but otherwise tolerating medication well.  We discussed comorbid concern for sleep apnea, and plan as follows:  - Increase topiramate to 50 mg daily - Continue Wellbutrin 300 mg daily - Home sleep study ordered

## 2022-10-08 NOTE — Progress Notes (Signed)
Primary Care / Sports Medicine Virtual Visit  Patient Information:  Patient ID: Michaela Cox, female DOB: 1980-11-29 Age: 42 y.o. MRN: [id number]   Michaela Cox is a pleasant 42 y.o. female presenting with the following:  Chief Complaint  Patient presents with   Depression with anxiety    Review of Systems: No fevers, chills, night sweats, weight loss, chest pain, or shortness of breath.   Patient Active Problem List   Diagnosis Date Noted   Healthcare maintenance 07/04/2022   Encounter for weight management 07/04/2022   Chronic musculoskeletal pain 04/15/2022   Depression with anxiety 04/15/2022   Flu vaccine need 08/28/2021   Annual physical exam 05/17/2021   TMJ tenderness, bilateral 03/22/2021   Neuropathic pain 09/29/2018   Pain in left foot 09/11/2018   Ulcerative colitis (HCC) 11/08/2015   Past Medical History:  Diagnosis Date   Eustachian tube disorder    GERD (gastroesophageal reflux disease)    Impingement syndrome of right shoulder region 03/01/2019   Lumbosacral spondylosis with radiculopathy 12/26/2020   Motion sickness    Peroneal tendinitis, left 03/22/2021   Plantar fasciitis, left 03/22/2021   PONV (postoperative nausea and vomiting)    Ulcerative colitis (HCC)    Outpatient Encounter Medications as of 10/08/2022  Medication Sig   buPROPion (WELLBUTRIN XL) 300 MG 24 hr tablet Take 1 tablet (300 mg total) by mouth daily.   desogestrel-ethinyl estradiol (MIRCETTE) 0.15-0.02/0.01 MG (21/5) tablet Take 1 tablet by mouth daily.   esomeprazole (NEXIUM) 20 MG capsule Take by mouth.   gabapentin (NEURONTIN) 600 MG tablet TAKE 1 TABLET BY MOUTH AT BEDTIME.   Multiple Vitamin (ONE DAILY) tablet Take 1 tablet by mouth daily.   Turmeric (QC TUMERIC COMPLEX PO) Take by mouth.   VITAMIN D PO Take by mouth daily.   [DISCONTINUED] sertraline (ZOLOFT) 50 MG tablet Take 1 tablet (50 mg total) by mouth daily.   [DISCONTINUED] topiramate (TOPAMAX) 25 MG tablet  Take 1 tablet (25 mg total) by mouth daily.   sertraline (ZOLOFT) 100 MG tablet Take 1 tablet (100 mg total) by mouth daily.   topiramate (TOPAMAX) 50 MG tablet Take 1 tablet (50 mg total) by mouth daily.   No facility-administered encounter medications on file as of 10/08/2022.   Past Surgical History:  Procedure Laterality Date   ANKLE SURGERY Left    COLONOSCOPY  12/2016   COLONOSCOPY N/A 10/08/2021   Procedure: COLONOSCOPY;  Surgeon: Midge Minium, MD;  Location: [facility];  Service: Endoscopy;  Laterality: N/A;    Virtual Visit via MyChart Video:   I connected with Michaela Cox on 10/08/22 via MyChart Video and verified that I am speaking with the correct person using appropriate identifiers.   The limitations, risks, security and privacy concerns of performing an evaluation and management service by MyChart Video, including the higher likelihood of inaccurate diagnoses and treatments, and the availability of in person appointments were reviewed. The possible need of an additional face-to-face encounter for complete and high quality delivery of care was discussed. The patient was also made aware that there may be a patient responsible charge related to this service. The patient expressed understanding and wishes to proceed.  Provider location is in medical facility. Patient location is at their home, different from provider location. People involved in care of the patient during this telehealth encounter were myself, my nurse/medical assistant, and my front office/scheduling team member.  Objective findings:   General: Speaking full sentences, no audible heavy breathing.  Sounds alert and appropriately interactive. Well-appearing. Face symmetric. Extraocular movements intact. Pupils equal and round. No nasal flaring or accessory muscle use visualized.  Independent interpretation of notes and tests performed by another provider:   None  Pertinent History, Exam,  Impression, and Recommendations:   Problem List Items Addressed This Visit       Other   Encounter for weight management    Has been on Wellbutrin and topiramate, maintains high level of exercise at least 5 times daily, denies any significant weight loss but otherwise tolerating medication well.  We discussed comorbid concern for sleep apnea, and plan as follows:  - Increase topiramate to 50 mg daily - Continue Wellbutrin 300 mg daily - Home sleep study ordered      Relevant Medications   topiramate (TOPAMAX) 50 MG tablet   Depression with anxiety    Has noted some mild improvement in stress, reflected in PHQ and GAD scores as well. Given ongoing symptoms, plan as follows:  - Increase sertraline to 100 mg daily - Find behavioral therapist group that is in network and contact us for referral if needed - We will coordinate follow-up after sleep study      Relevant Medications   sertraline (ZOLOFT) 100 MG tablet     Orders & Medications Medications:  Meds ordered this encounter  Medications   sertraline (ZOLOFT) 100 MG tablet    Sig: Take 1 tablet (100 mg total) by mouth daily.    Dispense:  90 tablet    Refill:  0   topiramate (TOPAMAX) 50 MG tablet    Sig: Take 1 tablet (50 mg total) by mouth daily.    Dispense:  90 tablet    Refill:  0   No orders of the defined types were placed in this encounter.    I discussed the above assessment and treatment plan with the patient. The patient was provided an opportunity to ask questions and all were answered. The patient agreed with the plan and demonstrated an understanding of the instructions.   The patient was advised to call back or seek an in-person evaluation if the symptoms worsen or if the condition fails to improve as anticipated.   I provided a total time of 30 minutes including both face-to-face and non-face-to-face time on 10/08/2022 inclusive of time utilized for medical chart review, information gathering, care  coordination with staff, and documentation completion.    Jerrol Banana, MD, [facility]   Primary Care Sports Medicine Primary Care and Sports Medicine at [facility]

## 2022-10-08 NOTE — Patient Instructions (Addendum)
-   Increase sertraline to 100 mg daily - Find behavioral therapist group that is in network and contact us for referral if needed - Increase topiramate to 50 mg daily - Continue Wellbutrin 300 mg daily - We will coordinate follow-up after sleep study

## 2022-10-08 NOTE — Assessment & Plan Note (Signed)
Has noted some mild improvement in stress, reflected in PHQ and GAD scores as well. Given ongoing symptoms, plan as follows:  - Increase sertraline to 100 mg daily - Find behavioral therapist group that is in network and contact us for referral if needed - We will coordinate follow-up after sleep study

## 2022-10-23 ENCOUNTER — Encounter: Payer: Self-pay | Admitting: Family Medicine

## 2022-11-02 ENCOUNTER — Other Ambulatory Visit: Payer: Self-pay | Admitting: Family Medicine

## 2022-11-02 DIAGNOSIS — F418 Other specified anxiety disorders: Secondary | ICD-10-CM

## 2022-11-04 NOTE — Telephone Encounter (Signed)
Requested by interface surescripts. Medication discontinued 07/04/22. Dose increased.  Requested Prescriptions  Pending Prescriptions Disp Refills   sertraline (ZOLOFT) 50 MG tablet [Pharmacy Med Name: SERTRALINE HCL 50 MG TABLET] 30 tablet 1    Sig: TAKE 1 TABLET BY MOUTH EVERY DAY     Psychiatry:  Antidepressants - SSRI - sertraline Passed - 11/02/2022  9:32 AM      Passed - AST in normal range and within 360 days    AST  Date Value Ref Range Status  07/04/2022 21 0 - 40 IU/L Final         Passed - ALT in normal range and within 360 days    ALT  Date Value Ref Range Status  07/04/2022 17 0 - 32 IU/L Final         Passed - Completed PHQ-2 or PHQ-9 in the last 360 days      Passed - Valid encounter within last 6 months    Recent Outpatient Visits           3 weeks ago Depression with anxiety   [place] [facility] at [facility], Ocie Bob, MD   2 months ago Depression with anxiety   [place] [facility] at [facility], Ocie Bob, MD   4 months ago Healthcare maintenance   [place] [facility] Medicine at [facility], Ocie Bob, MD   6 months ago Muscle weakness   [place] [facility] at [facility], Ocie Bob, MD   1 year ago Lumbosacral spondylosis with radiculopathy   [place] [facility] Medicine at [facility], Ocie Bob, MD              Refused Prescriptions Disp Refills   buPROPion (WELLBUTRIN SR) 150 MG 12 hr tablet [Pharmacy Med Name: BUPROPION HCL SR 150 MG TABLET] 180 tablet 1    Sig: TAKE 1 TABLET BY MOUTH DAILY FOR 7 DAYS THEN INCREASE TO 1 TAB TWICE DAILY     Psychiatry: Antidepressants - bupropion Passed - 11/02/2022  9:32 AM      Passed - Cr in normal range and within 360 days    Creatinine, Ser  Date Value Ref Range Status  07/04/2022 0.91 0.57 - 1.00 mg/dL Final         Passed - AST in normal  range and within 360 days    AST  Date Value Ref Range Status  07/04/2022 21 0 - 40 IU/L Final         Passed - ALT in normal range and within 360 days    ALT  Date Value Ref Range Status  07/04/2022 17 0 - 32 IU/L Final         Passed - Completed PHQ-2 or PHQ-9 in the last 360 days      Passed - Last BP in normal range    BP Readings from Last 1 Encounters:  09/03/22 120/76         Passed - Valid encounter within last 6 months    Recent Outpatient Visits           3 weeks ago Depression with anxiety    Primary Care & Sports Medicine at [facility], Ocie Bob, MD   2 months ago Depression with anxiety   [facility] Primary Care & Sports Medicine at [facility], Ocie Bob, MD  4 months ago Healthcare maintenance   [facility] Primary Care & Sports Medicine at [facility], Ocie Bob, MD   6 months ago Muscle weakness   [facility] Primary Care & Sports Medicine at [facility], Ocie Bob, MD   1 year ago Lumbosacral spondylosis with radiculopathy   [facility] Primary Care & Sports Medicine at [facility], Ocie Bob, MD

## 2022-11-04 NOTE — Telephone Encounter (Signed)
Requested medication (s) are due for refill today: no    Requested medication (s) are on the active medication list: no   Last refill:  last ordered 10/08/22 #30   Future visit scheduled: no  Notes to clinic:  dose increased to 100 mg . Do you want to discontinue 50 mg or refill?     Requested Prescriptions  Pending Prescriptions Disp Refills   sertraline (ZOLOFT) 50 MG tablet [Pharmacy Med Name: SERTRALINE HCL 50 MG TABLET] 30 tablet 1    Sig: TAKE 1 TABLET BY MOUTH EVERY DAY     Psychiatry:  Antidepressants - SSRI - sertraline Passed - 11/02/2022  9:32 AM      Passed - AST in normal range and within 360 days    AST  Date Value Ref Range Status  07/04/2022 21 0 - 40 IU/L Final         Passed - ALT in normal range and within 360 days    ALT  Date Value Ref Range Status  07/04/2022 17 0 - 32 IU/L Final         Passed - Completed PHQ-2 or PHQ-9 in the last 360 days      Passed - Valid encounter within last 6 months    Recent Outpatient Visits           3 weeks ago Depression with anxiety   [place] [facility] at [facility], Ocie Bob, MD   2 months ago Depression with anxiety   [place] [facility] at [facility], Ocie Bob, MD   4 months ago Healthcare maintenance   [place] [facility] Care & Sports Medicine at [facility], Ocie Bob, MD   6 months ago Muscle weakness   [place] [facility] at [facility], Ocie Bob, MD   1 year ago Lumbosacral spondylosis with radiculopathy   [place] [facility] Medicine at [facility], Ocie Bob, MD              Refused Prescriptions Disp Refills   buPROPion (WELLBUTRIN SR) 150 MG 12 hr tablet [Pharmacy Med Name: BUPROPION HCL SR 150 MG TABLET] 180 tablet 1    Sig: TAKE 1 TABLET BY MOUTH DAILY FOR 7 DAYS THEN INCREASE TO 1 TAB TWICE DAILY     Psychiatry: Antidepressants - bupropion  Passed - 11/02/2022  9:32 AM      Passed - Cr in normal range and within 360 days    Creatinine, Ser  Date Value Ref Range Status  07/04/2022 0.91 0.57 - 1.00 mg/dL Final         Passed - AST in normal range and within 360 days    AST  Date Value Ref Range Status  07/04/2022 21 0 - 40 IU/L Final         Passed - ALT in normal range and within 360 days    ALT  Date Value Ref Range Status  07/04/2022 17 0 - 32 IU/L Final         Passed - Completed PHQ-2 or PHQ-9 in the last 360 days      Passed - Last BP in normal range    BP Readings from Last 1 Encounters:  09/03/22 120/76         Passed - Valid encounter within last 6 months    Recent Outpatient Visits  3 weeks ago Depression with anxiety   [place] [facility] at [facility], Ocie Bob, MD   2 months ago Depression with anxiety   [facility] Primary Care & Sports Medicine at [facility], Ocie Bob, MD   4 months ago Healthcare maintenance   [facility] Primary Care & Sports Medicine at [facility], Ocie Bob, MD   6 months ago Muscle weakness   [facility] Primary Care & Sports Medicine at [facility], Ocie Bob, MD   1 year ago Lumbosacral spondylosis with radiculopathy   [facility] Primary Care & Sports Medicine at [facility], Ocie Bob, MD

## 2022-12-09 ENCOUNTER — Encounter: Payer: Self-pay | Admitting: Family Medicine

## 2022-12-10 NOTE — Telephone Encounter (Signed)
Please review. I don't see a referral for a sleep study.  KP

## 2022-12-16 ENCOUNTER — Other Ambulatory Visit: Payer: Self-pay | Admitting: Family Medicine

## 2022-12-16 DIAGNOSIS — M5410 Radiculopathy, site unspecified: Secondary | ICD-10-CM

## 2022-12-17 ENCOUNTER — Other Ambulatory Visit: Payer: Self-pay

## 2022-12-17 DIAGNOSIS — G4730 Sleep apnea, unspecified: Secondary | ICD-10-CM

## 2022-12-17 MED ORDER — GABAPENTIN 600 MG PO TABS: 600.0000 mg | ORAL_TABLET | Freq: Every day | ORAL | 2 refills | Status: DC

## 2022-12-17 NOTE — Telephone Encounter (Signed)
Please review.  KP

## 2022-12-17 NOTE — Telephone Encounter (Signed)
Please review. Pt has a question about stopping medication.  KP

## 2022-12-19 ENCOUNTER — Encounter: Payer: Self-pay | Admitting: Internal Medicine

## 2023-01-10 ENCOUNTER — Other Ambulatory Visit: Payer: Self-pay | Admitting: Family Medicine

## 2023-01-10 DIAGNOSIS — F418 Other specified anxiety disorders: Secondary | ICD-10-CM

## 2023-01-11 ENCOUNTER — Other Ambulatory Visit: Payer: Self-pay | Admitting: Family Medicine

## 2023-01-11 DIAGNOSIS — Z7689 Persons encountering health services in other specified circumstances: Secondary | ICD-10-CM

## 2023-01-13 NOTE — Telephone Encounter (Signed)
 Requested Prescriptions  Pending Prescriptions Disp Refills   sertraline  (ZOLOFT ) 100 MG tablet [Pharmacy Med Name: SERTRALINE  HCL 100 MG TABLET] 90 tablet 0    Sig: TAKE 1 TABLET BY MOUTH EVERY DAY     Psychiatry:  Antidepressants - SSRI - sertraline  Passed - 01/13/2023  3:44 PM      Passed - AST in normal range and within 360 days    AST  Date Value Ref Range Status  07/04/2022 21 0 - 40 IU/L Final         Passed - ALT in normal range and within 360 days    ALT  Date Value Ref Range Status  07/04/2022 17 0 - 32 IU/L Final         Passed - Completed PHQ-2 or PHQ-9 in the last 360 days      Passed - Valid encounter within last 6 months    Recent Outpatient Visits           3 months ago Depression with anxiety   [place] [facility] at [facility], Selinda PARAS, MD   4 months ago Depression with anxiety   [facility] Primary Care & Sports Medicine at [facility], Selinda PARAS, MD   6 months ago Healthcare maintenance   [facility] Primary Care & Sports Medicine at [facility], Selinda PARAS, MD   9 months ago Muscle weakness   [facility] Primary Care & Sports Medicine at [facility], Selinda PARAS, MD   1 year ago Lumbosacral spondylosis with radiculopathy   [facility] Care & Sports Medicine at [facility], Selinda PARAS, MD

## 2023-01-14 NOTE — Telephone Encounter (Signed)
 Requested Prescriptions  Pending Prescriptions Disp Refills   topiramate  (TOPAMAX ) 50 MG tablet [Pharmacy Med Name: TOPIRAMATE  50 MG TABLET] 90 tablet 0    Sig: TAKE 1 TABLET BY MOUTH EVERY DAY     Neurology: Anticonvulsants - topiramate  & zonisamide Passed - 01/14/2023  1:43 PM      Passed - Cr in normal range and within 360 days    Creatinine, Ser  Date Value Ref Range Status  07/04/2022 0.91 0.57 - 1.00 mg/dL Final         Passed - CO2 in normal range and within 360 days    CO2  Date Value Ref Range Status  07/04/2022 25 20 - 29 mmol/L Final         Passed - ALT in normal range and within 360 days    ALT  Date Value Ref Range Status  07/04/2022 17 0 - 32 IU/L Final         Passed - AST in normal range and within 360 days    AST  Date Value Ref Range Status  07/04/2022 21 0 - 40 IU/L Final         Passed - Completed PHQ-2 or PHQ-9 in the last 360 days      Passed - Valid encounter within last 12 months    Recent Outpatient Visits           3 months ago Depression with anxiety   [place] [facility] at [facility], Selinda PARAS, MD   4 months ago Depression with anxiety   [facility] Sports Medicine at [facility], Selinda PARAS, MD   6 months ago Healthcare maintenance   [facility] Primary Care & Sports Medicine at [facility], Selinda PARAS, MD   9 months ago Muscle weakness   [facility] Primary Care & Sports Medicine at [facility], Selinda PARAS, MD   1 year ago Lumbosacral spondylosis with radiculopathy   [facility] Primary Care & Sports Medicine at [facility], Selinda PARAS, MD

## 2023-02-06 ENCOUNTER — Encounter (INDEPENDENT_AMBULATORY_CARE_PROVIDER_SITE_OTHER): Payer: Self-pay

## 2023-02-07 ENCOUNTER — Ambulatory Visit: Payer: Commercial Managed Care - PPO | Admitting: Internal Medicine

## 2023-02-07 ENCOUNTER — Encounter: Payer: Self-pay | Admitting: Internal Medicine

## 2023-02-07 VITALS — BP 130/92 | HR 98 | Temp 97.6°F | Ht 63.0 in | Wt 163.6 lb

## 2023-02-07 DIAGNOSIS — G4733 Obstructive sleep apnea (adult) (pediatric): Secondary | ICD-10-CM | POA: Diagnosis not present

## 2023-02-07 NOTE — Progress Notes (Signed)
Name: Michaela Cox MRN: [id number] DOB: [birth year]    CHIEF COMPLAINT:  EXCESSIVE DAYTIME SLEEPINESS ASSESSMENT OF SLEEP APNEA   HISTORY OF PRESENT ILLNESS: Patient is seen today for problems and issues with sleep related to excessive daytime sleepiness Patient  has been having sleep problems for many years Patient has been having excessive daytime sleepiness for a long time Patient has been having extreme fatigue and tiredness, lack of energy +  very Loud snoring every night + struggling breathe at night and gasps for air + morning headaches + Nonrefreshing sleep  Discussed sleep data and reviewed with patient.  Encouraged proper weight management.  Discussed driving precautions and its relationship with hypersomnolence.  Discussed operating dangerous equipment and its relationship with hypersomnolence.  Discussed sleep hygiene, and benefits of a fixed sleep waked time.  The importance of getting eight or more hours of sleep discussed with patient.  Discussed limiting the use of the computer and television before bedtime.  Decrease naps during the day, so night time sleep will become enhanced.  Limit caffeine, and sleep deprivation.  HTN, stroke, and heart failure are potential risk factors.   Patient never feels rested wakes up several times throughout the night Works as a Visual merchandiser for practice Very fatigued throughout the day Usually goes to bed at 10 PM wakes up at 6 AM Takes about 2 hours to fall asleep Wakes up 5 times in a night Patient currently weighs 161 pounds at 5 feet 3 inches  Patient denies any history of chest pain shortness of breath No wheezing Non-smoker Nonalcoholic  EPWORTH SLEEP SCORE 14  No exacerbation at this time No evidence of heart failure at this time No evidence or signs of infection at this time No respiratory distress No fevers, chills, nausea, vomiting, diarrhea No evidence of lower extremity edema No evidence  hemoptysis   PAST MEDICAL HISTORY :   has a past medical history of Eustachian tube disorder, GERD (gastroesophageal reflux disease), Impingement syndrome of right shoulder region (03/01/2019), Lumbosacral spondylosis with radiculopathy (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  has a past surgical history that includes Colonoscopy (12/2016); Colonoscopy (N/A, 10/08/2021); and Ankle surgery (Left). Prior to Admission medications   Medication Sig Start Date End Date Taking? Authorizing Provider  buPROPion (WELLBUTRIN XL) 300 MG 24 hr tablet Take 1 tablet (300 mg total) by mouth daily. 09/03/22  Yes Jerrol Banana, MD  esomeprazole (NEXIUM) 20 MG capsule Take by mouth.   Yes [provider]  gabapentin (NEURONTIN) 600 MG tablet Take 1 tablet (600 mg total) by mouth at bedtime. 12/17/22  Yes Jerrol Banana, MD  Multiple Vitamin (ONE DAILY) tablet Take 1 tablet by mouth daily.   Yes [provider]  sertraline (ZOLOFT) 100 MG tablet TAKE 1 TABLET BY MOUTH EVERY DAY 01/13/23  Yes Jerrol Banana, MD  topiramate (TOPAMAX) 50 MG tablet TAKE 1 TABLET BY MOUTH EVERY DAY 01/14/23  Yes Jerrol Banana, MD  Turmeric (QC TUMERIC COMPLEX PO) Take by mouth.   Yes [provider]  VITAMIN D PO Take by mouth daily.   Yes [provider]  desogestrel-ethinyl estradiol (MIRCETTE) 0.15-0.02/0.01 MG (21/5) tablet Take 1 tablet by mouth daily. 02/14/22 02/14/23  [provider]   No Known Allergies  FAMILY HISTORY:  family history includes Asthma in her daughter; Cancer in her mother; Diabetes in her paternal grandfather; GER disease in  her sister; Hypertension in her maternal grandmother and paternal grandfather; Liver disease in her mother; Lung cancer in her maternal aunt and maternal grandfather. SOCIAL HISTORY:  reports that she  quit smoking about 11 years ago. Her smoking use included cigarettes. She started smoking about 26 years ago. She has never used smokeless tobacco. She reports current alcohol use. She reports that she does not currently use drugs after having used the following drugs: Marijuana.   Review of Systems:  Gen:  Denies  fever, sweats, chills weight loss  HEENT: Denies blurred vision, double vision, ear pain, eye pain, hearing loss, nose bleeds, sore throat Cardiac:  No dizziness, chest pain or heaviness, chest tightness,edema, No JVD Resp:   No cough, -sputum production, -shortness of breath,-wheezing, -hemoptysis,  Gi: Denies swallowing difficulty, stomach pain, nausea or vomiting, diarrhea, constipation, bowel incontinence Gu:  Denies bladder incontinence, burning urine Ext:   Denies Joint pain, stiffness or swelling Skin: Denies  skin rash, easy bruising or bleeding or hives Endoc:  Denies polyuria, polydipsia , polyphagia or weight change Psych:   Denies depression, insomnia or hallucinations  Other:  All other systems negative   ALL OTHER ROS ARE NEGATIVE  BP (!) 130/92 (BP Location: Left Arm, Patient Position: Sitting, Cuff Size: Normal)   Pulse 98   Temp 97.6 F (36.4 C) (Temporal)   Ht 5\' 3"  (1.6 m)   Wt 163 lb 9.6 oz (74.2 kg)   SpO2 99%   BMI 28.98 kg/m     Physical Examination:   General Appearance: No distress  EYES PERRLA, EOM intact.   NECK Supple, No JVD Mallampati 4 Pulmonary: normal breath sounds, No wheezing.  CardiovascularNormal S1,S2.  No m/r/g.   Abdomen: Benign, Soft, non-tender. Skin:   warm, no rashes, no ecchymosis  Extremities: normal, no cyanosis, clubbing. Neuro:without focal findings,  speech normal  PSYCHIATRIC: Mood, affect within normal limits.   ALL OTHER ROS ARE NEGATIVE    ASSESSMENT AND PLAN  43 year old pleasant female patient with signs and symptoms of excessive daytime sleepiness with probable underlying diagnosis of obstructive  sleep apnea in the setting of overweight   Recommend Sleep Study for definitve diagnosis Recommend home sleep study Be aware of reduced alertness and do not drive or operate heavy machinery if experiencing this or drowsiness.  Exercise encouraged, as tolerated. Encouraged proper weight management.  Important to get eight or more hours of sleep  Limiting the use of the computer and television before bedtime.  Decrease naps during the day, so night time sleep will become enhanced.  Limit caffeine, and sleep deprivation.  HTN, stroke, uncontrolled diabetes and heart failure are potential risk factors.   Overweight -recommend significant weight loss -recommend changing diet  Deconditioned state -Recommend increased daily activity and exercise   MEDICATION ADJUSTMENTS/LABS AND TESTS ORDERED: Recommend Sleep Study Recommend weight loss Avoid Allergens and Irritants Avoid secondhand smoke Avoid SICK contacts Recommend  Masking  when appropriate Recommend Keep up-to-date with vaccinations   CURRENT MEDICATIONS REVIEWED AT LENGTH WITH PATIENT TODAY   Patient  satisfied with Plan of action and management. All questions answered  Follow up  3 months   I spent a total of  61 minutes reviewing chart data, face-to-face evaluation with the patient, counseling and coordination of care as detailed above.    Lucie Leather, M.D.  [facility]  Medical Director [facility] Medical Director [facility] Cardio-Pulmonary Department

## 2023-02-07 NOTE — Patient Instructions (Signed)
Will recommend obtaining home sleep study to assess for sleep apnea  Recommend weight loss  Avoid Allergens and Irritants Avoid secondhand smoke Avoid SICK contacts Recommend  Masking  when appropriate Recommend Keep up-to-date with vaccinations

## 2023-03-10 ENCOUNTER — Encounter: Payer: Self-pay | Admitting: Internal Medicine

## 2023-03-17 ENCOUNTER — Ambulatory Visit: Payer: No Typology Code available for payment source | Admitting: Internal Medicine

## 2023-03-27 ENCOUNTER — Encounter: Admitting: Pulmonary Disease

## 2023-03-27 DIAGNOSIS — G4733 Obstructive sleep apnea (adult) (pediatric): Secondary | ICD-10-CM

## 2023-04-12 ENCOUNTER — Other Ambulatory Visit: Payer: Self-pay | Admitting: Family Medicine

## 2023-04-12 DIAGNOSIS — M5410 Radiculopathy, site unspecified: Secondary | ICD-10-CM

## 2023-04-14 NOTE — Telephone Encounter (Signed)
 Requested Prescriptions  Pending Prescriptions Disp Refills   gabapentin (NEURONTIN) 600 MG tablet [Pharmacy Med Name: GABAPENTIN 600 MG TABLET] 30 tablet 2    Sig: TAKE 1 TABLET BY MOUTH AT BEDTIME.     Neurology: Anticonvulsants - gabapentin Failed - 04/14/2023  3:27 PM      Failed - Valid encounter within last 12 months    Recent Outpatient Visits   None            Passed - Cr in normal range and within 360 days    Creatinine, Ser  Date Value Ref Range Status  07/04/2022 0.91 0.57 - 1.00 mg/dL Final         Passed - Completed PHQ-2 or PHQ-9 in the last 360 days       Patient will need an office visit for additional refills.

## 2023-04-17 ENCOUNTER — Other Ambulatory Visit: Payer: Self-pay | Admitting: Family Medicine

## 2023-04-17 DIAGNOSIS — Z7689 Persons encountering health services in other specified circumstances: Secondary | ICD-10-CM

## 2023-04-17 NOTE — Telephone Encounter (Signed)
 OV needed for additional refills, 30 day supply given until OV can be made.   Requested Prescriptions  Pending Prescriptions Disp Refills   topiramate (TOPAMAX) 50 MG tablet [Pharmacy Med Name: TOPIRAMATE 50 MG TABLET] 30 tablet 0    Sig: TAKE 1 TABLET BY MOUTH EVERY DAY     Neurology: Anticonvulsants - topiramate & zonisamide Failed - 04/17/2023  2:25 PM      Failed - Valid encounter within last 12 months    Recent Outpatient Visits   None            Passed - Cr in normal range and within 360 days    Creatinine, Ser  Date Value Ref Range Status  07/04/2022 0.91 0.57 - 1.00 mg/dL Final         Passed - CO2 in normal range and within 360 days    CO2  Date Value Ref Range Status  07/04/2022 25 20 - 29 mmol/L Final         Passed - ALT in normal range and within 360 days    ALT  Date Value Ref Range Status  07/04/2022 17 0 - 32 IU/L Final         Passed - AST in normal range and within 360 days    AST  Date Value Ref Range Status  07/04/2022 21 0 - 40 IU/L Final         Passed - Completed PHQ-2 or PHQ-9 in the last 360 days

## 2023-04-28 ENCOUNTER — Ambulatory Visit: Admitting: Internal Medicine

## 2023-04-28 ENCOUNTER — Encounter: Payer: Self-pay | Admitting: Internal Medicine

## 2023-04-28 VITALS — BP 128/88 | HR 77 | Temp 97.6°F | Ht 63.0 in | Wt 156.0 lb

## 2023-04-28 DIAGNOSIS — G4733 Obstructive sleep apnea (adult) (pediatric): Secondary | ICD-10-CM | POA: Diagnosis not present

## 2023-04-28 DIAGNOSIS — J9611 Chronic respiratory failure with hypoxia: Secondary | ICD-10-CM

## 2023-04-28 NOTE — Patient Instructions (Signed)
 Please start CPAP for sleep apnea  Referral to DME company [employer] AIR-TOUCH FIT N30i MASK START AUTO CPAP 4-10 cm h20  Avoid Allergens and Irritants Avoid secondhand smoke Avoid SICK contacts Recommend  Masking  when appropriate Recommend Keep up-to-date with vaccinations

## 2023-04-28 NOTE — Progress Notes (Signed)
 Name: Michaela Cox MRN: [id number] DOB: Jan 05, 1981    Home sleep test March 2025 AHI 4% 7 AHI 3% 11 Significant hypoxia less than 88% for 15 minutes 34 snoring episodes per hour Significant desaturations throughout the night Diagnosis mild OSA with hypoxia   CHIEF COMPLAINT:  Follow-up assessment for sleep apnea    HISTORY OF PRESENT ILLNESS:  Discussed sleep data and reviewed with patient.  Encouraged proper weight management.  Discussed driving precautions and its relationship with hypersomnolence.  Discussed operating dangerous equipment and its relationship with hypersomnolence.  Discussed sleep hygiene, and benefits of a fixed sleep waked time.  The importance of getting eight or more hours of sleep discussed with patient.  Discussed limiting the use of the computer and television before bedtime.  Decrease naps during the day, so night time sleep will become enhanced.  Limit caffeine, and sleep deprivation.  HTN, stroke, and heart failure are potential risk factors.   HST reviewed in detail MILD osa with hypoxia Recommend starting auto-CPAP therapy Patient is not a candidate for Inspire device  Patient never feels rested wakes up several times throughout the night Works as a Visual merchandiser for practice Very fatigued throughout the day Usually goes to bed at 10 PM wakes up at 6 AM Takes about 2 hours to fall asleep Wakes up 5 times in a night Patient currently weighs 161 pounds at 5 feet 3 inches  Non-smoker Nonalcoholic  No exacerbation at this time No evidence of heart failure at this time No evidence or signs of infection at this time No respiratory distress No fevers, chills, nausea, vomiting, diarrhea No evidence of lower extremity edema No evidence hemoptysis   PAST MEDICAL HISTORY :   has a past medical history of Eustachian tube disorder, GERD (gastroesophageal reflux disease), Impingement syndrome of right shoulder region (03/01/2019),  Lumbosacral spondylosis with radiculopathy (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  has a past surgical history that includes Colonoscopy (12/2016); Colonoscopy (N/A, 10/08/2021); and Ankle surgery (Left). Prior to Admission medications   Medication Sig Start Date End Date Taking? Authorizing Provider  buPROPion  (WELLBUTRIN  XL) 300 MG 24 hr tablet Take 1 tablet (300 mg total) by mouth daily. 09/03/22  Yes Ma Saupe, MD  esomeprazole (NEXIUM) 20 MG capsule Take by mouth.   Yes [provider]  gabapentin  (NEURONTIN ) 600 MG tablet Take 1 tablet (600 mg total) by mouth at bedtime. 12/17/22  Yes Ma Saupe, MD  Multiple Vitamin (ONE DAILY) tablet Take 1 tablet by mouth daily.   Yes [provider]  sertraline  (ZOLOFT ) 100 MG tablet TAKE 1 TABLET BY MOUTH EVERY DAY 01/13/23  Yes Matthews, Jason J, MD  topiramate  (TOPAMAX ) 50 MG tablet TAKE 1 TABLET BY MOUTH EVERY DAY 01/14/23  Yes Matthews, Jason J, MD  Turmeric (QC TUMERIC COMPLEX PO) Take by mouth.   Yes [provider]  VITAMIN D  PO Take by mouth daily.   Yes [provider]  desogestrel-ethinyl estradiol (MIRCETTE) 0.15-0.02/0.01 MG (21/5) tablet Take 1 tablet by mouth daily. 02/14/22 02/14/23  [provider]   No Known Allergies  FAMILY HISTORY:  family history includes Asthma in her daughter; Cancer in her mother; Diabetes in her paternal grandfather; GER disease in her sister; Hypertension in her maternal grandmother and paternal grandfather; Liver disease in her mother; Lung cancer in her maternal aunt and maternal grandfather. SOCIAL HISTORY:  reports that she  quit smoking about 11 years ago. Her smoking use included cigarettes. She started smoking about 26 years ago. She has never used smokeless tobacco. She reports current alcohol use. She reports that  she does not currently use drugs after having used the following drugs: Marijuana.   BP 128/88 (BP Location: Left Arm, Patient Position: Sitting, Cuff Size: Normal)   Pulse 77   Temp 97.6 F (36.4 C) (Temporal)   Ht 5\' 3"  (1.6 m)   Wt 156 lb (70.8 kg)   SpO2 100%   BMI 27.63 kg/m    Review of Systems: Gen:  Denies  fever, sweats, chills weight loss  HEENT: Denies blurred vision, double vision, ear pain, eye pain, hearing loss, nose bleeds, sore throat Cardiac:  No dizziness, chest pain or heaviness, chest tightness,edema, No JVD Resp:   No cough, -sputum production, -shortness of breath,-wheezing, -hemoptysis,  Other:  All other systems negative   Physical Examination:   General Appearance: No distress  EYES PERRLA, EOM intact.   NECK Supple, No JVD Pulmonary: normal breath sounds, No wheezing.  CardiovascularNormal S1,S2.  No m/r/g.   Abdomen: Benign, Soft, non-tender. Neurology UE/LE 5/5 strength, no focal deficits Ext pulses intact, cap refill intact ALL OTHER ROS ARE NEGATIVE    ASSESSMENT AND PLAN  43 year old pleasant female patient with signs and symptoms of excessive daytime sleepiness with probable underlying diagnosis of obstructive sleep apnea in the setting of overweight  Home sleep study confirms mild sleep apnea with hypoxia Recommend starting CPAP therapy  Referral to DME company [employer]-TOU[employer] FIT N30i MASK START AUTO CPAP 4-12 cm h20  Overweight -recommend significant weight loss -recommend changing diet  Deconditioned state -Recommend increased daily activity and exercise   MEDICATION ADJUSTMENTS/LABS AND TESTS ORDERED: Referral to DME company [employer]-T[employer] FIT N30i MASK START AUTO CPAP 4-12 cm h20 Avoid Allergens and Irritants Avoid secondhand smoke Avoid SICK contacts Recommend  Masking  when appropriate Recommend Keep up-to-date with vaccinations   CURRENT MEDICATIONS REVIEWED AT LENGTH WITH PATIENT  TODAY   Patient  satisfied with Plan of action and management. All questions answered  Follow up  3 months   I spent a total of  42 minutes reviewing chart data, face-to-face evaluation with the patient, counseling and coordination of care as detailed above.    Lady Pier, M.D.  [facility]  Medical Director [facility] Medical Director [facility] Cardio-Pulmonary Department

## 2023-05-05 ENCOUNTER — Ambulatory Visit: Admitting: Family Medicine

## 2023-05-05 VITALS — BP 136/80 | HR 80 | Ht 63.0 in | Wt 156.0 lb

## 2023-05-05 DIAGNOSIS — G4733 Obstructive sleep apnea (adult) (pediatric): Secondary | ICD-10-CM | POA: Insufficient documentation

## 2023-05-05 DIAGNOSIS — E663 Overweight: Secondary | ICD-10-CM

## 2023-05-05 DIAGNOSIS — Z7689 Persons encountering health services in other specified circumstances: Secondary | ICD-10-CM

## 2023-05-05 DIAGNOSIS — Z6827 Body mass index (BMI) 27.0-27.9, adult: Secondary | ICD-10-CM

## 2023-05-05 MED ORDER — BUPROPION HCL ER (XL) 300 MG PO TB24: 300.0000 mg | ORAL_TABLET | Freq: Every day | ORAL | 3 refills | Status: AC

## 2023-05-05 MED ORDER — TOPIRAMATE 50 MG PO TABS
50.0000 mg | ORAL_TABLET | Freq: Two times a day (BID) | ORAL | 0 refills | Status: DC
Start: 2023-05-05 — End: 2023-09-11

## 2023-05-05 NOTE — Assessment & Plan Note (Addendum)
 History of Present Illness Michaela Cox is a 43 year old female who presents for weight management and medication review.  She has experienced fluctuations in her weight over the past few years. In 2022, her weight was 151 lbs, which decreased to 144-145 lbs by March of last year. However, it increased to 163 lbs in August and is currently at 156 lbs, indicating a recent weight loss of 7 lbs.  She is currently taking topiramate  50 mg once daily and bupropion  at a 300 mg dose. Topiramate  helps suppress her appetite, allowing her to have minimal meals such as a smoothie for breakfast and lunch. She is open to adjusting her topiramate  dosage if necessary.  She has been active, although she experienced a decrease in exercise intensity due to a foot issue. She is frustrated as she exercises regularly but does not see significant weight changes. She also tries to eat healthily but finds it challenging at times.  She is concerned about entering perimenopause and its potential impact on her weight.  Physical Exam MEASUREMENTS: Weight- 156.  Assessment and Plan Body mass index is 27.63 kg/m. Weight decreased from 163 lbs to 156 lbs since August. Topiramate  and bupropion  used for weight management. Discussed set point theory and gradual weight loss to prevent metabolic resistance. Emphasized balanced diet, exercise, mindful eating, sleep, and stress management.  Will be increasing topiramate  dosage if tolerated. - Increase topiramate  to 50 mg twice daily. - Continue bupropion  at current 300 mg dose with three-month refill. - Educate on gradual calorie reduction and balanced meals with more protein. - Encourage regular exercise and mindful eating. - Discuss importance of sleep and stress management in weight loss. - Review set point information below.

## 2023-05-05 NOTE — Progress Notes (Signed)
 Primary Care / Sports Medicine Office Visit  Patient Information:  Patient ID: Michaela Cox, female DOB: Sep 02, 1980 Age: 43 y.o. MRN: [id number]   Michaela Cox is a pleasant 43 y.o. female presenting with the following:  Chief Complaint  Patient presents with   Weight Management Screening    Patient presents today to get refill on her topiramate .     Vitals:   05/05/23 0953  BP: 136/80  Pulse: 80  SpO2: 99%   Vitals:   05/05/23 0953  Weight: 156 lb (70.8 kg)  Height: 5\' 3"  (1.6 m)   Body mass index is 27.63 kg/m.  No results found.   Independent interpretation of notes and tests performed by another provider:   None  Procedures performed:   None  Pertinent History, Exam, Impression, and Recommendations:   Problem List Items Addressed This Visit     Encounter for weight management   Relevant Medications   topiramate  (TOPAMAX ) 50 MG tablet   buPROPion  (WELLBUTRIN  XL) 300 MG 24 hr tablet   OSA on CPAP   She has recetnly been diagnosed with mild sleep apnea and experiences tiredness, which is attributed to frequent breathing interruptions during sleep.  Sleep apnea Mild sleep apnea causing fatigue. Explained CPAP therapy benefits for sleep quality and relation to insulin sensitivity and glycemic control. Effective CPAP use may reduce stress. - Initiate CPAP therapy as recommended by pulmonary specialist.      Overweight with body mass index (BMI) of 27 to 27.9 in adult - Primary   History of Present Illness Michaela Cox is a 43 year old female who presents for weight management and medication review.  She has experienced fluctuations in her weight over the past few years. In 2022, her weight was 151 lbs, which decreased to 144-145 lbs by March of last year. However, it increased to 163 lbs in August and is currently at 156 lbs, indicating a recent weight loss of 7 lbs.  She is currently taking topiramate  50 mg once daily and bupropion  at a 300 mg  dose. Topiramate  helps suppress her appetite, allowing her to have minimal meals such as a smoothie for breakfast and lunch. She is open to adjusting her topiramate  dosage if necessary.  She has been active, although she experienced a decrease in exercise intensity due to a foot issue. She is frustrated as she exercises regularly but does not see significant weight changes. She also tries to eat healthily but finds it challenging at times.  She is concerned about entering perimenopause and its potential impact on her weight.  Physical Exam MEASUREMENTS: Weight- 156.  Assessment and Plan Body mass index is 27.63 kg/m. Weight decreased from 163 lbs to 156 lbs since August. Topiramate  and bupropion  used for weight management. Discussed set point theory and gradual weight loss to prevent metabolic resistance. Emphasized balanced diet, exercise, mindful eating, sleep, and stress management.  Will be increasing topiramate  dosage if tolerated. - Increase topiramate  to 50 mg twice daily. - Continue bupropion  at current 300 mg dose with three-month refill. - Educate on gradual calorie reduction and balanced meals with more protein. - Encourage regular exercise and mindful eating. - Discuss importance of sleep and stress management in weight loss. - Review set point information below.        Orders & Medications Medications:  Meds ordered this encounter  Medications   topiramate  (TOPAMAX ) 50 MG tablet    Sig: Take 1 tablet (50 mg total) by mouth 2 (two) times  daily.    Dispense:  180 tablet    Refill:  0   buPROPion  (WELLBUTRIN  XL) 300 MG 24 hr tablet    Sig: Take 1 tablet (300 mg total) by mouth daily.    Dispense:  90 tablet    Refill:  3   No orders of the defined types were placed in this encounter.    Return in about 3 months (around 08/08/2023) for CPE.     Ma Saupe, MD, [facility]   Primary Care Sports Medicine Primary Care and Sports Medicine at [facility]

## 2023-05-05 NOTE — Patient Instructions (Signed)
 Patient Plan  Sleep Apnea: 1. Start using CPAP therapy as recommended to improve sleep quality and reduce fatigue.  Weight Management: 1. Increase topiramate  dosage to 50 mg twice daily. 2. Continue taking bupropion  at 300 mg once daily. 3. Gradually reduce calorie intake and focus on balanced meals with more protein. 4. Maintain regular exercise and practice mindful eating. 5. Prioritize getting enough sleep and manage stress effectively.  Red Flags: - Contact your healthcare provider if you experience any side effects from the medication changes or if your symptoms of fatigue worsen.  Key Ways to Help Your Body Reach and Maintain a Healthier "Set Point"  Gradual Calorie Reduction: Eating slightly fewer calories over time can help your body adjust to a lower weight without triggering hunger or metabolic slow-downs. Focus on balanced meals with more protein and fewer refined carbs.  Reference: Johnson Nanny, [facility], Wilding JP. Management of obesity. The Lancet. 2016.  Regular Exercise: A mix of aerobic and strength training helps maintain weight loss by boosting metabolism and burning fat. High-intensity interval training (HIIT) may be especially effective.  Reference: [employer], Montani JP. Body composition and thermogenesis in pathways to obesity. Obesity Reviews. 2012.  Mindful Eating: Paying attention to your hunger and fullness cues can help prevent overeating and lead to more sustainable weight loss.  Reference: [facility] TA. Behavioral treatment of obesity. [facility]. 2011.  Sleep and Stress Management: Improving sleep and managing stress can reduce cravings and overeating by balancing hormones that control hunger and fullness.  Reference: Ileene Mallick, Hu FB. Short sleep duration and weight gain. Obesity ([place]). 2008.  Medications: Drugs like GLP-1 receptor agonists can help reduce hunger and aid in long-term weight loss  when combined with lifestyle changes.  Reference: Pi-Sunyer X. The medical management of obesity. NEJM. 2019.  Bariatric Surgery: For some, weight-loss surgery can effectively reset the body's weight set point by altering how the body processes food, leading to lasting weight loss.  Reference: Stefater MA, Wilson-Prez HE, et al. Insights from mechanistic comparisons of bariatric surgeries. Endocrine Reviews. 2012

## 2023-05-05 NOTE — Assessment & Plan Note (Signed)
 She has recetnly been diagnosed with mild sleep apnea and experiences tiredness, which is attributed to frequent breathing interruptions during sleep.  Sleep apnea Mild sleep apnea causing fatigue. Explained CPAP therapy benefits for sleep quality and relation to insulin sensitivity and glycemic control. Effective CPAP use may reduce stress. - Initiate CPAP therapy as recommended by pulmonary specialist.

## 2023-06-26 ENCOUNTER — Encounter: Payer: Self-pay | Admitting: Internal Medicine

## 2023-07-10 ENCOUNTER — Encounter: Payer: Self-pay | Admitting: Family Medicine

## 2023-07-10 LAB — HM MAMMOGRAPHY

## 2023-07-30 ENCOUNTER — Ambulatory Visit: Admitting: Internal Medicine

## 2023-07-30 ENCOUNTER — Telehealth: Payer: Self-pay | Admitting: Internal Medicine

## 2023-07-30 NOTE — Telephone Encounter (Signed)
 Patient needs CPAP compliance between 08/29/2023 to 10/27/2023.

## 2023-08-11 NOTE — Telephone Encounter (Signed)
 Patient needs CPAP compliance between 08/29/2023 to 10/27/2023.

## 2023-08-27 ENCOUNTER — Other Ambulatory Visit: Payer: Self-pay | Admitting: Family Medicine

## 2023-08-27 DIAGNOSIS — Z7689 Persons encountering health services in other specified circumstances: Secondary | ICD-10-CM

## 2023-08-28 NOTE — Telephone Encounter (Signed)
 Requested medications are due for refill today.  yes  Requested medications are on the active medications list.  yes  Last refill. 05/05/2023 #180 0 rf  Future visit scheduled.   yes  Notes to clinic.  Labs are expired.    Requested Prescriptions  Pending Prescriptions Disp Refills   topiramate  (TOPAMAX ) 50 MG tablet [Pharmacy Med Name: TOPIRAMATE  50 MG TABLET] 30 tablet     Sig: TAKE 1 TABLET BY MOUTH EVERY DAY     Neurology: Anticonvulsants - topiramate  & zonisamide Failed - 08/28/2023  4:25 PM      Failed - Cr in normal range and within 360 days    Creatinine, Ser  Date Value Ref Range Status  07/04/2022 0.91 0.57 - 1.00 mg/dL Final         Failed - CO2 in normal range and within 360 days    CO2  Date Value Ref Range Status  07/04/2022 25 20 - 29 mmol/L Final         Failed - ALT in normal range and within 360 days    ALT  Date Value Ref Range Status  07/04/2022 17 0 - 32 IU/L Final         Failed - AST in normal range and within 360 days    AST  Date Value Ref Range Status  07/04/2022 21 0 - 40 IU/L Final         Passed - Completed PHQ-2 or PHQ-9 in the last 360 days      Passed - Valid encounter within last 12 months    Recent Outpatient Visits           3 months ago Overweight with body mass index (BMI) of 27 to 27.9 in adult   [facility] Primary Care & Sports Medicine at [facility], Selinda PARAS, MD       Future Appointments             In 2 weeks Alvia, Selinda PARAS, MD [facility] Primary Care & Sports Medicine at [facility], [facility]

## 2023-08-30 ENCOUNTER — Other Ambulatory Visit: Payer: Self-pay | Admitting: Family Medicine

## 2023-08-30 DIAGNOSIS — Z7689 Persons encountering health services in other specified circumstances: Secondary | ICD-10-CM

## 2023-09-01 ENCOUNTER — Encounter: Admitting: Family Medicine

## 2023-09-01 NOTE — Telephone Encounter (Signed)
 Requested medication (s) are due for refill today: yes  Requested medication (s) are on the active medication list: yes  Last refill:  05/05/23  Future visit scheduled: yes  Notes to clinic:  patient request courtesy refill until OV in Sept. Routing for review, labs expired     Requested Prescriptions  Pending Prescriptions Disp Refills   topiramate  (TOPAMAX ) 50 MG tablet [Pharmacy Med Name: TOPIRAMATE  50 MG TABLET] 180 tablet 0    Sig: TAKE 1 TABLET BY MOUTH TWICE A DAY     Neurology: Anticonvulsants - topiramate  & zonisamide Failed - 09/01/2023  3:49 PM      Failed - Cr in normal range and within 360 days    Creatinine, Ser  Date Value Ref Range Status  07/04/2022 0.91 0.57 - 1.00 mg/dL Final         Failed - CO2 in normal range and within 360 days    CO2  Date Value Ref Range Status  07/04/2022 25 20 - 29 mmol/L Final         Failed - ALT in normal range and within 360 days    ALT  Date Value Ref Range Status  07/04/2022 17 0 - 32 IU/L Final         Failed - AST in normal range and within 360 days    AST  Date Value Ref Range Status  07/04/2022 21 0 - 40 IU/L Final         Passed - Completed PHQ-2 or PHQ-9 in the last 360 days      Passed - Valid encounter within last 12 months    Recent Outpatient Visits           3 months ago Overweight with body mass index (BMI) of 27 to 27.9 in adult   [facility] Primary Care & Sports Medicine at [facility], Selinda PARAS, MD       Future Appointments             In 1 week Alvia, Selinda PARAS, MD [facility] Primary Care & Sports Medicine at [facility], [facility]

## 2023-09-02 ENCOUNTER — Ambulatory Visit: Admitting: Internal Medicine

## 2023-09-02 ENCOUNTER — Encounter: Payer: Self-pay | Admitting: Internal Medicine

## 2023-09-02 VITALS — BP 110/80 | HR 75 | Temp 99.2°F | Ht 63.0 in | Wt 153.2 lb

## 2023-09-02 DIAGNOSIS — G4733 Obstructive sleep apnea (adult) (pediatric): Secondary | ICD-10-CM

## 2023-09-02 NOTE — Progress Notes (Signed)
 Name: Michaela Cox MRN: [id number] DOB: 04-24-1980    Home sleep test March 2025 AHI 4% 7 AHI 3% 11 Significant hypoxia less than 88% for 15 minutes 34 snoring episodes per hour Significant desaturations throughout the night Diagnosis mild OSA with hypoxia   CHIEF COMPLAINT:  Follow-up assessment for OSA    HISTORY OF PRESENT ILLNESS:  Discussed sleep data and reviewed with patient.  Encouraged proper weight management.  Discussed driving precautions and its relationship with hypersomnolence.  Discussed sleep hygiene, and benefits of a fixed sleep waked time.  The importance of getting eight or more hours of sleep discussed with patient.  Discussed limiting the use of the computer and television before bedtime.  Decrease naps during the day, so night time sleep will become enhanced.  Limit caffeine, and sleep deprivation.   Patient uses and benefits from therapy Using CPAP nightly and with naps Pressure setting is comfortable and is sleeping well. CPAP 4-10 AHI 0.6 Patient having issues with mask therefore will plan for AirFit N30 I mask nasal cradle  No exacerbation at this time No evidence of heart failure at this time No evidence or signs of infection at this time No respiratory distress No fevers, chills, nausea, vomiting, diarrhea No evidence of lower extremity edema No evidence hemoptysis   HST reviewed in detail MILD osa with hypoxia Continue  auto-CPAP therapy Patient is not a candidate for Inspire device  Non-smoker Nonalcoholic    PAST MEDICAL HISTORY :   has a past medical history of Eustachian tube disorder, GERD (gastroesophageal reflux disease), Impingement syndrome of right shoulder region (03/01/2019), Lumbosacral spondylosis with radiculopathy (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  has a past  surgical history that includes Colonoscopy (12/2016); Colonoscopy (N/A, 10/08/2021); and Ankle surgery (Left). Prior to Admission medications   Medication Sig Start Date End Date Taking? Authorizing Provider  buPROPion  (WELLBUTRIN  XL) 300 MG 24 hr tablet Take 1 tablet (300 mg total) by mouth daily. 09/03/22  Yes Alvia Selinda PARAS, MD  esomeprazole (NEXIUM) 20 MG capsule Take by mouth.   Yes [provider]  gabapentin  (NEURONTIN ) 600 MG tablet Take 1 tablet (600 mg total) by mouth at bedtime. 12/17/22  Yes Alvia Selinda PARAS, MD  Multiple Vitamin (ONE DAILY) tablet Take 1 tablet by mouth daily.   Yes [provider]  sertraline  (ZOLOFT ) 100 MG tablet TAKE 1 TABLET BY MOUTH EVERY DAY 01/13/23  Yes Matthews, Jason J, MD  topiramate  (TOPAMAX ) 50 MG tablet TAKE 1 TABLET BY MOUTH EVERY DAY 01/14/23  Yes Matthews, Jason J, MD  Turmeric (QC TUMERIC COMPLEX PO) Take by mouth.   Yes [provider]  VITAMIN D  PO Take by mouth daily.   Yes [provider]  desogestrel-ethinyl estradiol (MIRCETTE) 0.15-0.02/0.01 MG (21/5) tablet Take 1 tablet by mouth daily. 02/14/22 02/14/23  [provider]   No Known Allergies  FAMILY HISTORY:  family history includes Alcohol abuse in her brother, father, mother, and paternal uncle; Anxiety disorder in her mother; Asthma in her daughter and daughter; Cancer in her maternal aunt, maternal aunt, maternal grandfather, maternal uncle, and mother; Depression in her maternal grandmother and mother; Diabetes in her paternal grandfather; GER disease in her sister; Heart disease in her maternal grandfather and mother; Hyperlipidemia in her maternal grandfather and mother; Hypertension in her maternal grandmother, mother, and paternal grandfather; Liver disease in her mother; Lung cancer in her maternal  aunt and maternal grandfather; Miscarriages / [place] in her paternal grandmother; Obesity in her maternal aunt, maternal grandfather, paternal  grandfather, paternal grandmother, and paternal uncle; Stroke in her paternal grandfather and paternal grandmother. SOCIAL HISTORY:  reports that she quit smoking about 11 years ago. Her smoking use included cigarettes. She started smoking about 26 years ago. She has never used smokeless tobacco. She reports current alcohol use. She reports that she does not currently use drugs after having used the following drugs: Marijuana.   BP 110/80   Pulse 75   Temp 99.2 F (37.3 C)   Ht 5' 3 (1.6 m)   Wt 153 lb 3.2 oz (69.5 kg)   LMP  (LMP Unknown)   SpO2 99%   BMI 27.14 kg/m      Physical Examination:   General Appearance: No distress  EYES PERRLA, EOM intact.   NECK Supple, No JVD Pulmonary: normal breath sounds, No wheezing.  CardiovascularNormal S1,S2.  No m/r/g.   Abdomen: Benign, Soft, non-tender. Neurology UE/LE 5/5 strength, no focal deficits Ext pulses intact, cap refill intact ALL OTHER ROS ARE NEGATIVE     ASSESSMENT AND PLAN  43 year old pleasant female patient with  Home sleep study confirms mild sleep apnea with hypoxia  Assessment & Plan OSA (obstructive sleep apnea)  Assessment of OSA Previous AHI 7-11 Continue CPAP as prescribed  Excellent compliance report Reviewed compliance report in detail with patient Patient definitely benefits the use of CPAP therapy as prescribed Using CPAP nightly and with naps Pressure setting is comfortable and is sleeping well. CPAP prescription 4-10 AHI reduced to 0.6  No evidence of acute heart failure at this time No respiratory distress No fevers, chills, nausea, vomiting, diarrhea No evidence hemoptysis  Patient Instructions Continue to use CPAP every night, minimum of 4-6 hours a night.  Change equipment every 30 days or as directed by DME.  Wash your tubing with warm soap and water  daily, hang to dry. Wash humidifier portion weekly. Use bottled, distilled water  and change daily   Be aware of reduced alertness  and do not drive or operate heavy machinery if experiencing this or drowsiness.  Exercise encouraged, as tolerated. Encouraged proper weight management.  Important to get eight or more hours of sleep  Limiting the use of the computer and television before bedtime.  Decrease naps during the day, so night time sleep will become enhanced.  Limit caffeine, and sleep deprivation.  HTN, stroke, uncontrolled diabetes and heart failure are potential risk factors.  Risk of untreated sleep apnea including cardiac arrhthymias, stroke, DM, pulm HTN.  NASAL CRADLE RESMED AIR-TOUCH FIT N30i MASK    MEDICATION ADJUSTMENTS/LABS AND TESTS ORDERED: Continue CPAP as prescribed Avoid Allergens and Irritants Avoid secondhand smoke Avoid SICK contacts Recommend  Masking  when appropriate Recommend Keep up-to-date with vaccinations   Patient  satisfied with Plan of action and management. All questions answered   Follow up 6 months   I spent a total of 41 minutes dedicated to the care of this patient on the date of this encounter to include pre-visit review of records, face-to-face time with the patient discussing conditions above, post visit ordering of testing, clinical documentation with the electronic health record, making appropriate referrals as documented, and communicating necessary information to the patient's healthcare team.    The Patient requires high complexity decision making for assessment and support, frequent evaluation and titration of therapies, application of advanced monitoring technologies and extensive interpretation of multiple databases.  Patient satisfied with  Plan of action and management. All questions answered    Nickolas Alm Cellar, M.D.  [facility]  Medical Director [facility] Medical Director [facility] Cardio-Pulmonary Department

## 2023-09-02 NOTE — Patient Instructions (Signed)
 Plan to give N30i AirFit mask  Excellent Job A+ GOLD STAR!!  Continue CPAP as prescribed  Patient Instructions Continue to use CPAP every night, minimum of 4-6 hours a night.  Change equipment every 30 days or as directed by DME.  Wash your tubing with warm soap and water  daily, hang to dry. Wash humidifier portion weekly. Use bottled, distilled water  and change daily   Be aware of reduced alertness and do not drive or operate heavy machinery if experiencing this or drowsiness.  Exercise encouraged, as tolerated. Encouraged proper weight management.  Important to get eight or more hours of sleep  Limiting the use of the computer and television before bedtime.  Decrease naps during the day, so night time sleep will become enhanced.  Limit caffeine, and sleep deprivation.    Avoid Allergens and Irritants Avoid secondhand smoke Avoid SICK contacts Recommend  Masking  when appropriate Recommend Keep up-to-date with vaccinations

## 2023-09-11 ENCOUNTER — Encounter: Payer: Self-pay | Admitting: Family Medicine

## 2023-09-11 ENCOUNTER — Ambulatory Visit (INDEPENDENT_AMBULATORY_CARE_PROVIDER_SITE_OTHER): Admitting: Family Medicine

## 2023-09-11 VITALS — BP 116/74 | HR 98 | Ht 63.0 in | Wt 154.8 lb

## 2023-09-11 DIAGNOSIS — F418 Other specified anxiety disorders: Secondary | ICD-10-CM

## 2023-09-11 DIAGNOSIS — Z23 Encounter for immunization: Secondary | ICD-10-CM

## 2023-09-11 DIAGNOSIS — G8929 Other chronic pain: Secondary | ICD-10-CM

## 2023-09-11 DIAGNOSIS — G4733 Obstructive sleep apnea (adult) (pediatric): Secondary | ICD-10-CM

## 2023-09-11 DIAGNOSIS — Z0000 Encounter for general adult medical examination without abnormal findings: Secondary | ICD-10-CM | POA: Diagnosis not present

## 2023-09-11 DIAGNOSIS — Z6827 Body mass index (BMI) 27.0-27.9, adult: Secondary | ICD-10-CM

## 2023-09-11 DIAGNOSIS — Z7689 Persons encountering health services in other specified circumstances: Secondary | ICD-10-CM

## 2023-09-11 DIAGNOSIS — G4709 Other insomnia: Secondary | ICD-10-CM | POA: Insufficient documentation

## 2023-09-11 DIAGNOSIS — K51919 Ulcerative colitis, unspecified with unspecified complications: Secondary | ICD-10-CM

## 2023-09-11 DIAGNOSIS — E663 Overweight: Secondary | ICD-10-CM

## 2023-09-11 DIAGNOSIS — M7918 Myalgia, other site: Secondary | ICD-10-CM | POA: Diagnosis not present

## 2023-09-11 MED ORDER — TRAZODONE HCL 50 MG PO TABS
25.0000 mg | ORAL_TABLET | Freq: Every evening | ORAL | 0 refills | Status: DC | PRN
Start: 2023-09-11 — End: 2023-10-07

## 2023-09-11 MED ORDER — TOPIRAMATE 50 MG PO TABS: 50.0000 mg | ORAL_TABLET | Freq: Two times a day (BID) | ORAL | 3 refills | Status: AC

## 2023-09-11 NOTE — Assessment & Plan Note (Signed)
 Weight loss and appetite changes - Weight loss attributed to decreased appetite and dietary modifications - Increased consumption of smoothies in place of solid foods  Weight Management Weight management ongoing. Current weight 154 lbs, down from 163 lbs in January. Reports dietary changes and regular physical activity. - Restart topiramate . - Encourage continuation of current exercise routine. - Suggest increasing exercise intensity on some days. - Monitor weight and dietary habits.

## 2023-09-11 NOTE — Assessment & Plan Note (Signed)
 Ulcerative colitis with associated inflammatory arthritis Managed by rheumatology. Recent flare improved with prednisone. Inflammatory markers monitored. Current treatment includes prednisone and Adalimumab. - Continue prednisone as prescribed by rheumatology. - Continue Adalimumab (Hadleyma). - Contact rheumatology if joint pain persists or worsens after steroid treatment. - Can consider Cymbalta  (duloxetine ) restart for pain.

## 2023-09-11 NOTE — Assessment & Plan Note (Signed)
 Annual examination completed, risk stratification labs ordered, anticipatory guidance provided.  We will follow labs once resulted.

## 2023-09-11 NOTE — Assessment & Plan Note (Signed)
 Sleep disturbance - Obstructive sleep apnea managed with CPAP initiated in early August - Still adjusting to CPAP therapy   Obstructive sleep apnea Managed with CPAP since early August. She is still adjusting and experiences night awakenings. - Continue CPAP therapy. - Consider trial of low-dose trazodone  for sleep architecture improvement.

## 2023-09-11 NOTE — Assessment & Plan Note (Signed)
 Articular pain and inflammation - Joint pain primarily affecting the fingers, onset prior to starting prednisone - Pain and inflammation have decreased since initiation of prednisone on August 28th - No recent flare-ups prior to this episode - Previously trialed Cymbalta  for pain and mood, not currently taking it, but was effective for pain (not mood)  Chronic musculoskeletal pain - Await response from rheumatology medications in regards to pain - If any musculoskeletal pain remains, contact our office for consideration of Cymbalta  (duloxetine ) restart

## 2023-09-11 NOTE — Assessment & Plan Note (Signed)
 Mood and psychiatric status - Mood stable on Wellbutrin  (bupropion ) - Not currently taking sertraline  or Prozac  Mood stable. Previous medications included Cymbalta  and sertraline . - Continue bupropion  as prescribed. - Consider reintroducing Cymbalta  if pain management becomes necessary.

## 2023-09-11 NOTE — Progress Notes (Signed)
 Annual Physical Exam Visit  Patient Information:  Patient ID: Michaela Cox, female DOB: 23-Nov-1980 Age: 43 y.o. MRN: [id number]   Subjective:   CC: Annual Physical Exam  HPI:  Michaela Cox is here for their annual physical.  I reviewed the past medical history, family history, social history, surgical history, and allergies today and changes were made as necessary.  Please see the problem list section below for additional details.  Past Medical History: Past Medical History:  Diagnosis Date   Eustachian tube disorder    GERD (gastroesophageal reflux disease)    Impingement syndrome of right shoulder region 03/01/2019   Lumbosacral spondylosis with radiculopathy 12/26/2020   Motion sickness    Peroneal tendinitis, left 03/22/2021   Plantar fasciitis, left 03/22/2021   PONV (postoperative nausea and vomiting)    Ulcerative colitis (HCC)    Past Surgical History: Past Surgical History:  Procedure Laterality Date   ANKLE SURGERY Left    COLONOSCOPY  12/2016   COLONOSCOPY N/A 10/08/2021   Procedure: COLONOSCOPY;  Surgeon: Jinny Carmine, MD;  Location: [facility];  Service: Endoscopy;  Laterality: N/A;   Family History: Family History  Problem Relation Age of Onset   Cancer Mother        gallbladder   Liver disease Mother    Alcohol abuse Mother    Heart disease Mother    Hyperlipidemia Mother    Anxiety disorder Mother    Depression Mother    Hypertension Mother    Alcohol abuse Father    GER disease Sister    Alcohol abuse Brother    Asthma Daughter    Lung cancer Maternal Aunt    Cancer Maternal Aunt    Hypertension Maternal Grandmother    Depression Maternal Grandmother    Lung cancer Maternal Grandfather    Cancer Maternal Grandfather    Heart disease Maternal Grandfather    Hyperlipidemia Maternal Grandfather    Obesity Maternal Grandfather    Miscarriages / Stillbirths Paternal Grandmother    Stroke Paternal Grandmother    Obesity  Paternal Grandmother    Hypertension Paternal Grandfather    Diabetes Paternal Grandfather    Obesity Paternal Grandfather    Stroke Paternal Grandfather    Alcohol abuse Paternal Uncle    Obesity Paternal Uncle    Asthma Daughter    Cancer Maternal Aunt    Obesity Maternal Aunt    Cancer Maternal Uncle    Allergies: No Known Allergies Health Maintenance: Health Maintenance  Topic Date Due   Cervical Cancer Screening (HPV/Pap Cotest)  09/21/2023   Pneumococcal Vaccine (1 of 2 - PCV) 09/10/2024 (Originally 04/13/1999)   Hepatitis B Vaccines 19-59 Average Risk (1 of 3 - 19+ 3-dose series) 09/10/2024 (Originally 04/13/1999)   HPV VACCINES (1 - 3-dose SCDM series) 09/10/2024 (Originally 04/13/2007)   DTaP/Tdap/Td (3 - Td or Tdap) 09/10/2033   INFLUENZA VACCINE  Completed   Hepatitis C Screening  Completed   HIV Screening  Completed   Meningococcal B Vaccine  Aged Out   Colonoscopy  Discontinued   COVID-19 Vaccine  Discontinued    HM Colonoscopy          Completed or No Longer Recommended     Colonoscopy  Discontinued      Frequency changed to Never automatically (Topic No Longer Applies)   10/08/2021  Surgical Procedure: COLONOSCOPY   Only the first 1 history entries have been loaded, but more history exists.  Medications: Current Outpatient Medications on File Prior to Visit  Medication Sig Dispense Refill   buPROPion  (WELLBUTRIN  XL) 300 MG 24 hr tablet Take 1 tablet (300 mg total) by mouth daily. 90 tablet 3   esomeprazole (NEXIUM) 20 MG capsule Take by mouth.     HADLIMA 40 MG/0.8ML SOSY Inject 160 mg under the skin on day 0. Injection 80mg  under the skin on day 15. Starting day 29, transition to maintenance prescription.     Multiple Vitamin (ONE DAILY) tablet Take 1 tablet by mouth daily.     predniSONE (DELTASONE) 10 MG tablet Take 10 mg by mouth.     Turmeric (QC TUMERIC COMPLEX PO) Take by mouth.     VITAMIN D  PO Take by mouth daily.     No  current facility-administered medications on file prior to visit.    Objective:   Vitals:   09/11/23 0803  BP: 116/74  Pulse: 98  SpO2: 99%   Vitals:   09/11/23 0803  Weight: 154 lb 12.8 oz (70.2 kg)  Height: 5' 3 (1.6 m)   Body mass index is 27.42 kg/m.  General: Well Developed, well nourished, and in no acute distress.  Neuro: Alert and oriented x3, extra-ocular muscles intact, sensation grossly intact. Cranial nerves II through XII are grossly intact, motor, sensory, and coordinative functions are intact. HEENT: Normocephalic, atraumatic, neck supple, no masses, no lymphadenopathy, thyroid nonenlarged. Oropharynx, nasopharynx, external ear canals are unremarkable. Skin: Warm and dry, no rashes noted.  Cardiac: Regular rate and rhythm, no murmurs rubs or gallops. No peripheral edema. Pulses symmetric. Respiratory: Clear to auscultation bilaterally. Speaking in full sentences.  Abdominal: Soft, nontender, nondistended, positive bowel sounds, no masses, no organomegaly. Musculoskeletal: Stable, and with full range of motion.   Impression and Recommendations:   The patient was counselled, risk factors were discussed, and anticipatory guidance given.  Problem List Items Addressed This Visit     Chronic musculoskeletal pain   Articular pain and inflammation - Joint pain primarily affecting the fingers, onset prior to starting prednisone - Pain and inflammation have decreased since initiation of prednisone on August 28th - No recent flare-ups prior to this episode - Previously trialed Cymbalta  for pain and mood, not currently taking it, but was effective for pain (not mood)  Chronic musculoskeletal pain - Await response from rheumatology medications in regards to pain - If any musculoskeletal pain remains, contact our office for consideration of Cymbalta  (duloxetine ) restart        Relevant Medications   predniSONE (DELTASONE) 10 MG tablet   topiramate  (TOPAMAX ) 50 MG  tablet   traZODone  (DESYREL ) 50 MG tablet   Depression with anxiety   Mood and psychiatric status - Mood stable on Wellbutrin  (bupropion ) - Not currently taking sertraline  or Prozac  Mood stable. Previous medications included Cymbalta  and sertraline . - Continue bupropion  as prescribed. - Consider reintroducing Cymbalta  if pain management becomes necessary.      Relevant Medications   traZODone  (DESYREL ) 50 MG tablet   Encounter for weight management   Relevant Medications   topiramate  (TOPAMAX ) 50 MG tablet   Healthcare maintenance - Primary   Annual examination completed, risk stratification labs ordered, anticipatory guidance provided.  We will follow labs once resulted.      Relevant Orders   Lipid panel   TSH   Hemoglobin A1c   OSA on CPAP   Sleep disturbance - Obstructive sleep apnea managed with CPAP initiated in early August - Still adjusting to CPAP therapy  Obstructive sleep apnea Managed with CPAP since early August. She is still adjusting and experiences night awakenings. - Continue CPAP therapy. - Consider trial of low-dose trazodone  for sleep architecture improvement.      Other insomnia   Insomnia Insomnia persists with difficulty staying asleep. Previous trazodone  100 mg not tolerated due to grogginess. - Prescribe trazodone  50 mg, start at half a tablet (25 mg) and take nightly as needed. - Advise trial on a weekend to assess tolerance. - Adjust timing of dose if grogginess occurs.        Relevant Medications   traZODone  (DESYREL ) 50 MG tablet   Overweight with body mass index (BMI) of 27 to 27.9 in adult   Weight loss and appetite changes - Weight loss attributed to decreased appetite and dietary modifications - Increased consumption of smoothies in place of solid foods  Weight Management Weight management ongoing. Current weight 154 lbs, down from 163 lbs in January. Reports dietary changes and regular physical activity. - Restart  topiramate . - Encourage continuation of current exercise routine. - Suggest increasing exercise intensity on some days. - Monitor weight and dietary habits.      Ulcerative colitis (HCC)   Ulcerative colitis with associated inflammatory arthritis Managed by rheumatology. Recent flare improved with prednisone. Inflammatory markers monitored. Current treatment includes prednisone and Adalimumab. - Continue prednisone as prescribed by rheumatology. - Continue Adalimumab (Hadleyma). - Contact rheumatology if joint pain persists or worsens after steroid treatment. - Can consider Cymbalta  (duloxetine ) restart for pain.      Other Visit Diagnoses       Encounter for immunization       Relevant Orders   Flu vaccine trivalent PF, 6mos and older(Flulaval,Afluria,Fluarix,Fluzone) (Completed)        Orders & Medications Medications:  Meds ordered this encounter  Medications   topiramate  (TOPAMAX ) 50 MG tablet    Sig: Take 1 tablet (50 mg total) by mouth 2 (two) times daily.    Dispense:  180 tablet    Refill:  3   traZODone  (DESYREL ) 50 MG tablet    Sig: Take 0.5-1 tablets (25-50 mg total) by mouth at bedtime as needed for sleep.    Dispense:  30 tablet    Refill:  0   Orders Placed This Encounter  Procedures   Tdap vaccine greater than or equal to 7yo IM   Flu vaccine trivalent PF, 6mos and older(Flulaval,Afluria,Fluarix,Fluzone)   Lipid panel   TSH   Hemoglobin A1c     No follow-ups on file.    Selinda JINNY Ku, MD, [facility]   Primary Care Sports Medicine Primary Care and Sports Medicine at [facility]

## 2023-09-11 NOTE — Assessment & Plan Note (Signed)
 Insomnia Insomnia persists with difficulty staying asleep. Previous trazodone  100 mg not tolerated due to grogginess. - Prescribe trazodone  50 mg, start at half a tablet (25 mg) and take nightly as needed. - Advise trial on a weekend to assess tolerance. - Adjust timing of dose if grogginess occurs.

## 2023-09-11 NOTE — Patient Instructions (Addendum)
-   Obtain fasting labs with orders provided (can have water  or black coffee but otherwise no food or drink x 8 hours before labs) - Review information provided - Attend eye doctor annually, dentist every 6 months, work towards or maintain 30 minutes of moderate intensity physical activity at least 5 days per week, and consume a balanced diet - Return in 1 year for physical - Contact us  for any questions between now and then   Patient Plan for Post-Visit Guidance  Chronic Musculoskeletal Pain  - Await further guidance from rheumatology regarding pain management. - Contact the office if joint or musculoskeletal pain persists after steroid treatment to discuss restarting Cymbalta .  Depression with Anxiety  - Continue bupropion  (Wellbutrin ) as prescribed. - Consider restarting Cymbalta  if pain management is needed.  Insomnia and Sleep Disturbance  - Continue using CPAP for sleep apnea. - Take trazodone  50 mg for sleep as needed, starting with half a tablet (25 mg) nightly. - Try initial trazodone  on a weekend to see how you tolerate it. - Adjust the timing of trazodone  if you feel groggy in the morning.  Weight Management  - Restart topiramate  as prescribed. - Continue your current exercise routine and try to increase intensity on some days. - Monitor your weight and dietary habits.  Ulcerative Colitis  - Continue current medications as directed by your rheumatologist. - Contact rheumatology if joint pain persists or worsens after finishing steroids.  Healthcare Maintenance  - Annual exam completed; follow up on lab results when available.  Red Flags  - Seek medical attention if you experience severe joint swelling, sudden weakness, chest pain, shortness of breath, severe abdominal pain, blood in stool, signs of infection, or any new or worsening symptoms.

## 2023-09-17 ENCOUNTER — Ambulatory Visit: Payer: Self-pay | Admitting: Family Medicine

## 2023-09-17 LAB — LIPID PANEL
Chol/HDL Ratio: 2.8 ratio (ref 0.0–4.4)
Cholesterol, Total: 198 mg/dL (ref 100–199)
HDL: 72 mg/dL (ref >39)
LDL Chol Calc (NIH): 108 mg/dL — ABNORMAL HIGH (ref 0–99)
Triglycerides: 102 mg/dL (ref 0–149)
VLDL Cholesterol Cal: 18 mg/dL (ref 5–40)

## 2023-09-17 LAB — HEMOGLOBIN A1C
Est. average glucose Bld gHb Est-mCnc: 103 mg/dL
Hgb A1c MFr Bld: 5.2 % (ref 4.8–5.6)

## 2023-09-17 LAB — TSH: TSH: 2.36 u[IU]/mL (ref 0.450–4.500)

## 2023-09-22 ENCOUNTER — Encounter: Payer: Self-pay | Admitting: Family Medicine

## 2023-09-22 NOTE — Telephone Encounter (Signed)
 Pleas review and advise patient.  JM

## 2023-10-01 ENCOUNTER — Encounter: Payer: Self-pay | Admitting: Family Medicine

## 2023-10-01 LAB — HEPATITIS B SURFACE ANTIGEN
HM Hepatitis Screen: NEGATIVE
Hepatitis B Surface Ag: NEGATIVE

## 2023-10-01 LAB — POCT ERYTHROCYTE SEDIMENTATION RATE, NON-AUTOMATED: Sed Rate: 6

## 2023-10-04 ENCOUNTER — Other Ambulatory Visit: Payer: Self-pay | Admitting: Family Medicine

## 2023-10-04 DIAGNOSIS — G4709 Other insomnia: Secondary | ICD-10-CM

## 2023-10-07 NOTE — Telephone Encounter (Signed)
 Requested Prescriptions  Pending Prescriptions Disp Refills   traZODone  (DESYREL ) 50 MG tablet [Pharmacy Med Name: TRAZODONE  50 MG TABLET] 90 tablet 1    Sig: TAKE 0.5-1 TABLETS BY MOUTH AT BEDTIME AS NEEDED FOR SLEEP.     Psychiatry: Antidepressants - Serotonin Modulator Passed - 10/07/2023 11:54 AM      Passed - Completed PHQ-2 or PHQ-9 in the last 360 days      Passed - Valid encounter within last 6 months    Recent Outpatient Visits           3 weeks ago Healthcare maintenance   [facility] at [facility], Selinda PARAS, MD   5 months ago Overweight with body mass index (BMI) of 27 to 27.9 in adult   [facility] Medicine at [facility], Selinda PARAS, MD       Future Appointments             In 11 months Michaela Cox, Selinda PARAS, MD [facility] Primary Care & Sports Medicine at [facility], [phone] [place]

## 2023-10-08 NOTE — Telephone Encounter (Signed)
 PT response.  JM

## 2023-10-10 NOTE — Telephone Encounter (Signed)
 Lab results attached.  JM

## 2023-10-13 NOTE — Telephone Encounter (Signed)
 Completed. JM

## 2023-10-28 ENCOUNTER — Ambulatory Visit: Admitting: Internal Medicine

## 2023-12-24 ENCOUNTER — Encounter: Payer: Self-pay | Admitting: Internal Medicine

## 2024-02-10 ENCOUNTER — Ambulatory Visit: Admitting: Internal Medicine

## 2024-02-11 ENCOUNTER — Ambulatory Visit: Admitting: Internal Medicine

## 2024-02-11 ENCOUNTER — Encounter: Payer: Self-pay | Admitting: Internal Medicine

## 2024-02-11 VITALS — BP 120/78 | HR 80 | Temp 99.0°F | Ht 63.0 in | Wt 145.8 lb

## 2024-02-11 DIAGNOSIS — G4733 Obstructive sleep apnea (adult) (pediatric): Secondary | ICD-10-CM

## 2024-02-11 DIAGNOSIS — G4730 Sleep apnea, unspecified: Secondary | ICD-10-CM | POA: Diagnosis not present

## 2024-02-11 NOTE — Patient Instructions (Addendum)
 Need to be using your CPAP every night, minimum of 4-6 hours a night.  Change equipment as directed. Wash your tubing with warm soap and water  daily, hang to dry. Wash humidifier portion weekly. Use bottled, distilled water  and change daily Be aware of reduced alertness and do not drive or operate heavy machinery if experiencing this or drowsiness.  Exercise encouraged, as tolerated. Healthy weight management discussed.  Avoid or decrease alcohol consumption and medications that make you more sleepy, if possible. Notify if persistent daytime sleepiness occurs even with consistent use of PAP therapy.   Change CPAP supplies... Every month Mask cushions and/or nasal pillows CPAP machine filters Every 3 months Mask frame (not including the headgear) CPAP tubing Every 6 months Mask headgear Chin strap (if applicable) Humidifier water  tub      Excellent Job A+ GOLD STAR!!  Continue CPAP as prescribed  Patient Instructions Continue to use CPAP every night, minimum of 4-6 hours a night.  Change equipment every 30 days or as directed by DME.  Wash your tubing with warm soap and water  daily, hang to dry. Wash humidifier portion weekly. Use bottled, distilled water  and change daily   Be aware of reduced alertness and do not drive or operate heavy machinery if experiencing this or drowsiness.  Exercise encouraged, as tolerated. Encouraged proper weight management.  Important to get eight or more hours of sleep  Limiting the use of the computer and television before bedtime.  Decrease naps during the day, so night time sleep will become enhanced.  Limit caffeine, and sleep deprivation.    Avoid Allergens and Irritants Avoid secondhand smoke Avoid SICK contacts Recommend  Masking  when appropriate Recommend Keep up-to-date with vaccinations

## 2024-02-11 NOTE — Progress Notes (Signed)
 "  Name: Michaela Cox MRN: [id number] DOB: [birth year]    Home sleep test March 2025 AHI 4% 7 AHI 3% 11 Significant hypoxia less than 88% for 15 minutes 34 snoring episodes per hour Significant desaturations throughout the night Diagnosis mild OSA with hypoxia   CHIEF COMPLAINT:  Follow-up assessment for OSA    HISTORY OF PRESENT ILLNESS:  Discussed sleep data and reviewed with patient.  Encouraged proper weight management.  Discussed driving precautions and its relationship with hypersomnolence.  Discussed sleep hygiene, and benefits of a fixed sleep waked time.  The importance of getting eight or more hours of sleep discussed with patient.  Discussed limiting the use of the computer and television before bedtime.  Decrease naps during the day, so night time sleep will become enhanced.  Limit caffeine, and sleep deprivation.   Patient uses and benefits from therapy Using CPAP nightly and with naps Pressure setting is comfortable and is sleeping well. CPAP 4-10 AHI 0.6 Patient having issues with mask therefore will plan for AirFit N30 I mask nasal cradle  No exacerbation at this time No evidence of heart failure at this time No evidence or signs of infection at this time No respiratory distress No fevers, chills, nausea, vomiting, diarrhea No evidence of lower extremity edema No evidence hemoptysis   HST reviewed in detail MILD osa with hypoxia Continue  auto-CPAP therapy Patient is not a candidate for Inspire device  Non-smoker Nonalcoholic    PAST MEDICAL HISTORY :   has a past medical history of Eustachian tube disorder, GERD (gastroesophageal reflux disease), Impingement syndrome of right shoulder region (03/01/2019), Lumbosacral spondylosis with radiculopathy (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  has a past  surgical history that includes Colonoscopy (12/2016); Colonoscopy (N/A, 10/08/2021); and Ankle surgery (Left). Prior to Admission medications   Medication Sig Start Date End Date Taking? Authorizing Provider  buPROPion  (WELLBUTRIN  XL) 300 MG 24 hr tablet Take 1 tablet (300 mg total) by mouth daily. 09/03/22  Yes Alvia Selinda PARAS, MD  esomeprazole (NEXIUM) 20 MG capsule Take by mouth.   Yes [provider]  gabapentin  (NEURONTIN ) 600 MG tablet Take 1 tablet (600 mg total) by mouth at bedtime. 12/17/22  Yes Alvia Selinda PARAS, MD  Multiple Vitamin (ONE DAILY) tablet Take 1 tablet by mouth daily.   Yes [provider]  sertraline  (ZOLOFT ) 100 MG tablet TAKE 1 TABLET BY MOUTH EVERY DAY 01/13/23  Yes Matthews, Jason J, MD  topiramate  (TOPAMAX ) 50 MG tablet TAKE 1 TABLET BY MOUTH EVERY DAY 01/14/23  Yes Matthews, Jason J, MD  Turmeric (QC TUMERIC COMPLEX PO) Take by mouth.   Yes [provider]  VITAMIN D  PO Take by mouth daily.   Yes [provider]  desogestrel-ethinyl estradiol (MIRCETTE) 0.15-0.02/0.01 MG (21/5) tablet Take 1 tablet by mouth daily. 02/14/22 02/14/23  [provider]   No Known Allergies  FAMILY HISTORY:  family history includes Alcohol abuse in her brother, father, mother, and paternal uncle; Anxiety disorder in her mother; Asthma in her daughter and daughter; Cancer in her maternal aunt, maternal aunt, maternal grandfather, maternal uncle, and mother; Depression in her maternal grandmother and mother; Diabetes in her paternal grandfather; GER disease in her sister; Heart disease in her maternal grandfather and mother; Hyperlipidemia in her maternal grandfather and mother; Hypertension in her maternal grandmother, mother, and paternal grandfather; Liver disease in her mother; Lung cancer in her  maternal aunt and maternal grandfather; Miscarriages / Stillbirths in her paternal grandmother; Obesity in her maternal aunt, maternal grandfather, paternal  grandfather, paternal grandmother, and paternal uncle; Stroke in her paternal grandfather and paternal grandmother. SOCIAL HISTORY:  reports that she quit smoking about 12 years ago. Her smoking use included cigarettes. She started smoking about 27 years ago. She has never used smokeless tobacco. She reports current alcohol use. She reports that she does not currently use drugs after having used the following drugs: Marijuana.   There were no vitals taken for this visit.     Physical Examination:   General Appearance: No distress  EYES PERRLA, EOM intact.   NECK Supple, No JVD Pulmonary: normal breath sounds, No wheezing.  CardiovascularNormal S1,S2.  No m/r/g.   Abdomen: Benign, Soft, non-tender. Neurology UE/LE 5/5 strength, no focal deficits Ext pulses intact, cap refill intact ALL OTHER ROS ARE NEGATIVE     ASSESSMENT AND PLAN  44 year old pleasant female patient with  Home sleep study confirms mild sleep apnea with hypoxia  Assessment & Plan     MEDICATION ADJUSTMENTS/LABS AND TESTS ORDERED: Continue CPAP as prescribed Avoid Allergens and Irritants Avoid secondhand smoke Avoid SICK contacts Recommend  Masking  when appropriate Recommend Keep up-to-date with vaccinations   Patient  satisfied with Plan of action and management. All questions answered   Follow up 6 months   I spent a total of 41 minutes dedicated to the care of this patient on the date of this encounter to include pre-visit review of records, face-to-face time with the patient discussing conditions above, post visit ordering of testing, clinical documentation with the electronic health record, making appropriate referrals as documented, and communicating necessary information to the patient's healthcare team.    The Patient requires high complexity decision making for assessment and support, frequent evaluation and titration of therapies, application of advanced monitoring technologies and  extensive interpretation of multiple databases.  Patient satisfied with Plan of action and management. All questions answered    Nickolas Alm Cellar, M.D.  [facility]  Medical Director [facility] Medical Director [facility] Cardio-Pulmonary Department      "

## 2024-09-14 ENCOUNTER — Encounter: Admitting: Family Medicine
